# Patient Record
Sex: Female | Born: 1945
Health system: Southern US, Community
[De-identification: ages and names within clinical notes are randomized; demographics above are authoritative.]

## PROBLEM LIST (undated history)

## (undated) DIAGNOSIS — C189 Malignant neoplasm of colon, unspecified: Secondary | ICD-10-CM

## (undated) DIAGNOSIS — K862 Cyst of pancreas: Secondary | ICD-10-CM

## (undated) DIAGNOSIS — K7689 Other specified diseases of liver: Secondary | ICD-10-CM

## (undated) DIAGNOSIS — K222 Esophageal obstruction: Secondary | ICD-10-CM

## (undated) DIAGNOSIS — K635 Polyp of colon: Secondary | ICD-10-CM

## (undated) DIAGNOSIS — F1020 Alcohol dependence, uncomplicated: Secondary | ICD-10-CM

## (undated) DIAGNOSIS — I10 Essential (primary) hypertension: Secondary | ICD-10-CM

## (undated) HISTORY — DX: Other specified diseases of liver: K76.89

## (undated) HISTORY — DX: Essential (primary) hypertension: I10

## (undated) HISTORY — DX: Polyp of colon: K63.5

## (undated) HISTORY — DX: Esophageal obstruction: K22.2

## (undated) HISTORY — DX: Malignant neoplasm of colon, unspecified: C18.9

## (undated) HISTORY — DX: Alcohol dependence, uncomplicated: F10.20

## (undated) HISTORY — DX: Cyst of pancreas: K86.2

---

## 2000-05-04 ENCOUNTER — Encounter: Payer: Self-pay | Admitting: Orthopedic Surgery

## 2000-05-04 ENCOUNTER — Emergency Department (HOSPITAL_COMMUNITY): Admission: EM | Admit: 2000-05-04 | Discharge: 2000-05-04 | Payer: Self-pay | Admitting: Emergency Medicine

## 2000-05-04 ENCOUNTER — Encounter: Payer: Self-pay | Admitting: Emergency Medicine

## 2002-02-18 ENCOUNTER — Ambulatory Visit (HOSPITAL_COMMUNITY): Admission: RE | Admit: 2002-02-18 | Discharge: 2002-02-18 | Payer: Self-pay | Admitting: Family Medicine

## 2002-02-20 ENCOUNTER — Encounter: Admission: RE | Admit: 2002-02-20 | Discharge: 2002-02-20 | Payer: Self-pay | Admitting: Family Medicine

## 2002-02-20 ENCOUNTER — Encounter: Payer: Self-pay | Admitting: Family Medicine

## 2005-07-06 ENCOUNTER — Ambulatory Visit: Payer: Self-pay | Admitting: Internal Medicine

## 2005-08-21 ENCOUNTER — Ambulatory Visit: Payer: Self-pay | Admitting: Internal Medicine

## 2005-08-24 ENCOUNTER — Ambulatory Visit (HOSPITAL_COMMUNITY): Admission: RE | Admit: 2005-08-24 | Discharge: 2005-08-24 | Payer: Self-pay | Admitting: Internal Medicine

## 2006-11-01 ENCOUNTER — Ambulatory Visit: Payer: Self-pay | Admitting: Internal Medicine

## 2006-12-08 ENCOUNTER — Encounter: Payer: Self-pay | Admitting: Internal Medicine

## 2006-12-08 DIAGNOSIS — I10 Essential (primary) hypertension: Secondary | ICD-10-CM | POA: Insufficient documentation

## 2008-01-03 ENCOUNTER — Telehealth: Payer: Self-pay | Admitting: Internal Medicine

## 2008-01-07 ENCOUNTER — Encounter: Payer: Self-pay | Admitting: Internal Medicine

## 2008-02-24 ENCOUNTER — Ambulatory Visit: Payer: Self-pay | Admitting: Internal Medicine

## 2008-02-24 DIAGNOSIS — F17200 Nicotine dependence, unspecified, uncomplicated: Secondary | ICD-10-CM | POA: Insufficient documentation

## 2008-05-01 DIAGNOSIS — C189 Malignant neoplasm of colon, unspecified: Secondary | ICD-10-CM

## 2008-05-01 DIAGNOSIS — K862 Cyst of pancreas: Secondary | ICD-10-CM

## 2008-05-01 HISTORY — DX: Malignant neoplasm of colon, unspecified: C18.9

## 2008-05-01 HISTORY — PX: PORTACATH PLACEMENT: SHX2246

## 2008-05-01 HISTORY — DX: Cyst of pancreas: K86.2

## 2008-05-01 HISTORY — PX: COLECTOMY: SHX59

## 2008-11-23 ENCOUNTER — Encounter: Admission: RE | Admit: 2008-11-23 | Discharge: 2008-11-23 | Payer: Self-pay | Admitting: Emergency Medicine

## 2008-11-26 ENCOUNTER — Encounter: Payer: Self-pay | Admitting: Internal Medicine

## 2008-11-30 ENCOUNTER — Encounter: Admission: RE | Admit: 2008-11-30 | Discharge: 2008-11-30 | Payer: Self-pay | Admitting: General Surgery

## 2008-12-01 ENCOUNTER — Encounter: Admission: RE | Admit: 2008-12-01 | Discharge: 2008-12-01 | Payer: Self-pay | Admitting: General Surgery

## 2008-12-15 ENCOUNTER — Encounter: Payer: Self-pay | Admitting: Internal Medicine

## 2008-12-18 ENCOUNTER — Ambulatory Visit: Payer: Self-pay | Admitting: Internal Medicine

## 2008-12-18 DIAGNOSIS — R109 Unspecified abdominal pain: Secondary | ICD-10-CM | POA: Insufficient documentation

## 2008-12-18 DIAGNOSIS — Z85038 Personal history of other malignant neoplasm of large intestine: Secondary | ICD-10-CM | POA: Insufficient documentation

## 2008-12-18 DIAGNOSIS — F1020 Alcohol dependence, uncomplicated: Secondary | ICD-10-CM | POA: Insufficient documentation

## 2008-12-18 DIAGNOSIS — C189 Malignant neoplasm of colon, unspecified: Secondary | ICD-10-CM | POA: Insufficient documentation

## 2008-12-18 LAB — CONVERTED CEMR LAB: Vit D, 25-Hydroxy: 32 ng/mL (ref 30–89)

## 2008-12-21 ENCOUNTER — Ambulatory Visit: Payer: Self-pay | Admitting: Internal Medicine

## 2009-01-01 ENCOUNTER — Inpatient Hospital Stay (HOSPITAL_COMMUNITY): Admission: EM | Admit: 2009-01-01 | Discharge: 2009-01-08 | Payer: Self-pay | Admitting: Emergency Medicine

## 2009-01-01 ENCOUNTER — Encounter (INDEPENDENT_AMBULATORY_CARE_PROVIDER_SITE_OTHER): Payer: Self-pay | Admitting: Surgery

## 2009-01-12 ENCOUNTER — Encounter: Payer: Self-pay | Admitting: Internal Medicine

## 2009-01-13 ENCOUNTER — Ambulatory Visit: Payer: Self-pay | Admitting: Hematology & Oncology

## 2009-02-03 ENCOUNTER — Encounter: Payer: Self-pay | Admitting: Internal Medicine

## 2009-02-03 LAB — CBC WITH DIFFERENTIAL (CANCER CENTER ONLY)
EOS%: 1.7 % (ref 0.0–7.0)
Eosinophils Absolute: 0.1 10*3/uL (ref 0.0–0.5)
LYMPH#: 2.8 10*3/uL (ref 0.9–3.3)
MCH: 31.1 pg (ref 26.0–34.0)
MONO%: 6.1 % (ref 0.0–13.0)
NEUT#: 1.5 10*3/uL (ref 1.5–6.5)
Platelets: 246 10*3/uL (ref 145–400)
RBC: 4.09 10*6/uL (ref 3.70–5.32)

## 2009-02-04 ENCOUNTER — Encounter: Payer: Self-pay | Admitting: Internal Medicine

## 2009-02-04 LAB — COMPREHENSIVE METABOLIC PANEL
ALT: 24 U/L (ref 0–35)
AST: 25 U/L (ref 0–37)
BUN: 9 mg/dL (ref 6–23)
Calcium: 9 mg/dL (ref 8.4–10.5)
Creatinine, Ser: 0.88 mg/dL (ref 0.40–1.20)
Total Bilirubin: 0.3 mg/dL (ref 0.3–1.2)

## 2009-02-04 LAB — CEA: CEA: 1.1 ng/mL (ref 0.0–5.0)

## 2009-02-09 ENCOUNTER — Ambulatory Visit: Admission: RE | Admit: 2009-02-09 | Discharge: 2009-03-22 | Payer: Self-pay | Admitting: Radiation Oncology

## 2009-02-18 ENCOUNTER — Ambulatory Visit: Payer: Self-pay | Admitting: Hematology & Oncology

## 2009-02-19 ENCOUNTER — Ambulatory Visit (HOSPITAL_COMMUNITY): Admission: RE | Admit: 2009-02-19 | Discharge: 2009-02-19 | Payer: Self-pay | Admitting: Hematology & Oncology

## 2009-02-23 ENCOUNTER — Ambulatory Visit: Payer: Self-pay | Admitting: Internal Medicine

## 2009-02-23 DIAGNOSIS — Z85038 Personal history of other malignant neoplasm of large intestine: Secondary | ICD-10-CM | POA: Insufficient documentation

## 2009-03-08 LAB — CBC WITH DIFFERENTIAL (CANCER CENTER ONLY)
BASO#: 0 10*3/uL (ref 0.0–0.2)
BASO%: 0.8 % (ref 0.0–2.0)
EOS%: 1.7 % (ref 0.0–7.0)
HCT: 39 % (ref 34.8–46.6)
HGB: 13.3 g/dL (ref 11.6–15.9)
LYMPH%: 68.4 % — ABNORMAL HIGH (ref 14.0–48.0)
MCH: 29.9 pg (ref 26.0–34.0)
MCHC: 34 g/dL (ref 32.0–36.0)
MONO%: 6.9 % (ref 0.0–13.0)
NEUT%: 22.2 % — ABNORMAL LOW (ref 39.6–80.0)
RDW: 12.3 % (ref 10.5–14.6)

## 2009-03-08 LAB — COMPREHENSIVE METABOLIC PANEL
AST: 36 U/L (ref 0–37)
Albumin: 4.5 g/dL (ref 3.5–5.2)
BUN: 11 mg/dL (ref 6–23)
Calcium: 9.4 mg/dL (ref 8.4–10.5)
Chloride: 100 mEq/L (ref 96–112)
Creatinine, Ser: 0.71 mg/dL (ref 0.40–1.20)
Glucose, Bld: 115 mg/dL — ABNORMAL HIGH (ref 70–99)

## 2009-03-22 ENCOUNTER — Ambulatory Visit: Payer: Self-pay | Admitting: Hematology & Oncology

## 2009-03-22 LAB — COMPREHENSIVE METABOLIC PANEL
ALT: 34 U/L (ref 0–35)
AST: 38 U/L — ABNORMAL HIGH (ref 0–37)
Albumin: 4.1 g/dL (ref 3.5–5.2)
CO2: 28 mEq/L (ref 19–32)
Calcium: 9.7 mg/dL (ref 8.4–10.5)
Chloride: 105 mEq/L (ref 96–112)
Creatinine, Ser: 0.64 mg/dL (ref 0.40–1.20)
Potassium: 4 mEq/L (ref 3.5–5.3)
Sodium: 143 mEq/L (ref 135–145)
Total Protein: 7 g/dL (ref 6.0–8.3)

## 2009-03-22 LAB — CBC WITH DIFFERENTIAL (CANCER CENTER ONLY)
Eosinophils Absolute: 0 10*3/uL (ref 0.0–0.5)
MCV: 87 fL (ref 81–101)
MONO#: 0.3 10*3/uL (ref 0.1–0.9)
NEUT#: 0.1 10*3/uL — CL (ref 1.5–6.5)
Platelets: 116 10*3/uL — ABNORMAL LOW (ref 145–400)
RBC: 4.31 10*6/uL (ref 3.70–5.32)
WBC: 2.2 10*3/uL — ABNORMAL LOW (ref 3.9–10.0)

## 2009-03-30 ENCOUNTER — Encounter: Payer: Self-pay | Admitting: Internal Medicine

## 2009-03-30 LAB — CBC WITH DIFFERENTIAL (CANCER CENTER ONLY)
BASO#: 0 10*3/uL (ref 0.0–0.2)
BASO%: 0.9 % (ref 0.0–2.0)
EOS%: 1 % (ref 0.0–7.0)
MCH: 30.2 pg (ref 26.0–34.0)
MCHC: 34.3 g/dL (ref 32.0–36.0)
MONO%: 12.6 % (ref 0.0–13.0)
NEUT#: 1 10*3/uL — ABNORMAL LOW (ref 1.5–6.5)
Platelets: 212 10*3/uL (ref 145–400)
RDW: 13.5 % (ref 10.5–14.6)

## 2009-04-05 LAB — CBC WITH DIFFERENTIAL (CANCER CENTER ONLY)
BASO#: 0 10*3/uL (ref 0.0–0.2)
Eosinophils Absolute: 0.1 10*3/uL (ref 0.0–0.5)
HGB: 13.1 g/dL (ref 11.6–15.9)
LYMPH%: 43.9 % (ref 14.0–48.0)
MCH: 29.8 pg (ref 26.0–34.0)
MCV: 89 fL (ref 81–101)
MONO#: 0.3 10*3/uL (ref 0.1–0.9)
MONO%: 6.9 % (ref 0.0–13.0)
NEUT#: 2.2 10*3/uL (ref 1.5–6.5)
Platelets: 250 10*3/uL (ref 145–400)
RBC: 4.39 10*6/uL (ref 3.70–5.32)
WBC: 4.7 10*3/uL (ref 3.9–10.0)

## 2009-04-22 ENCOUNTER — Ambulatory Visit: Payer: Self-pay | Admitting: Hematology & Oncology

## 2009-04-26 ENCOUNTER — Telehealth: Payer: Self-pay | Admitting: Internal Medicine

## 2009-04-26 LAB — MANUAL DIFFERENTIAL (CHCC SATELLITE)
ALC: 1.7 10*3/uL (ref 0.6–2.2)
ANC (CHCC HP manual diff): 0.4 10*3/uL — CL (ref 1.5–6.7)
BASO: 1 % (ref 0–2)
PLT EST ~~LOC~~: ADEQUATE
RBC Comments: NORMAL

## 2009-04-26 LAB — COMPREHENSIVE METABOLIC PANEL
ALT: 10 U/L (ref 0–35)
AST: 8 U/L (ref 0–37)
Alkaline Phosphatase: 96 U/L (ref 39–117)
Calcium: 9.3 mg/dL (ref 8.4–10.5)
Chloride: 99 mEq/L (ref 96–112)
Creatinine, Ser: 0.95 mg/dL (ref 0.40–1.20)
Potassium: 3.3 mEq/L — ABNORMAL LOW (ref 3.5–5.3)

## 2009-04-26 LAB — CBC WITH DIFFERENTIAL (CANCER CENTER ONLY)
HCT: 38.8 % (ref 34.8–46.6)
MCH: 30.8 pg (ref 26.0–34.0)
MCV: 90 fL (ref 81–101)
RBC: 4.32 10*6/uL (ref 3.70–5.32)
WBC: 3 10*3/uL — ABNORMAL LOW (ref 3.9–10.0)

## 2009-04-27 ENCOUNTER — Ambulatory Visit: Payer: Self-pay | Admitting: Internal Medicine

## 2009-05-01 HISTORY — PX: OTHER SURGICAL HISTORY: SHX169

## 2009-05-03 ENCOUNTER — Encounter: Payer: Self-pay | Admitting: Internal Medicine

## 2009-05-03 LAB — COMPREHENSIVE METABOLIC PANEL
AST: 36 U/L (ref 0–37)
Alkaline Phosphatase: 83 U/L (ref 39–117)
BUN: 13 mg/dL (ref 6–23)
Calcium: 9.7 mg/dL (ref 8.4–10.5)
Chloride: 102 mEq/L (ref 96–112)
Glucose, Bld: 113 mg/dL — ABNORMAL HIGH (ref 70–99)
Total Protein: 7.6 g/dL (ref 6.0–8.3)

## 2009-05-03 LAB — CBC WITH DIFFERENTIAL (CANCER CENTER ONLY)
BASO#: 0 10*3/uL (ref 0.0–0.2)
LYMPH#: 1.9 10*3/uL (ref 0.9–3.3)
MCH: 30.6 pg (ref 26.0–34.0)
MCHC: 33 g/dL (ref 32.0–36.0)
MONO#: 0.5 10*3/uL (ref 0.1–0.9)
MONO%: 11.2 % (ref 0.0–13.0)
RDW: 17.2 % — ABNORMAL HIGH (ref 10.5–14.6)

## 2009-05-17 ENCOUNTER — Encounter: Payer: Self-pay | Admitting: Internal Medicine

## 2009-05-17 ENCOUNTER — Ambulatory Visit: Payer: Self-pay | Admitting: Hematology & Oncology

## 2009-05-17 LAB — CBC WITH DIFFERENTIAL (CANCER CENTER ONLY)
BASO%: 0.4 % (ref 0.0–2.0)
Eosinophils Absolute: 0 10*3/uL (ref 0.0–0.5)
HCT: 40.3 % (ref 34.8–46.6)
LYMPH%: 57.1 % — ABNORMAL HIGH (ref 14.0–48.0)
MCHC: 33.6 g/dL (ref 32.0–36.0)
NEUT#: 1.1 10*3/uL — ABNORMAL LOW (ref 1.5–6.5)
NEUT%: 31.7 % — ABNORMAL LOW (ref 39.6–80.0)
RBC: 4.38 10*6/uL (ref 3.70–5.32)
RDW: 17.2 % — ABNORMAL HIGH (ref 10.5–14.6)
WBC: 3.5 10*3/uL — ABNORMAL LOW (ref 3.9–10.0)

## 2009-05-17 LAB — COMPREHENSIVE METABOLIC PANEL
ALT: 23 U/L (ref 0–35)
Alkaline Phosphatase: 83 U/L (ref 39–117)
BUN: 11 mg/dL (ref 6–23)
Calcium: 9.4 mg/dL (ref 8.4–10.5)
Creatinine, Ser: 0.89 mg/dL (ref 0.40–1.20)
Glucose, Bld: 81 mg/dL (ref 70–99)
Potassium: 3.5 mEq/L (ref 3.5–5.3)
Total Bilirubin: 0.3 mg/dL (ref 0.3–1.2)
Total Protein: 8.1 g/dL (ref 6.0–8.3)

## 2009-05-17 LAB — CEA: CEA: 1.2 ng/mL (ref 0.0–5.0)

## 2009-05-31 ENCOUNTER — Encounter: Payer: Self-pay | Admitting: Internal Medicine

## 2009-05-31 LAB — TECHNOLOGIST REVIEW CHCC SATELLITE

## 2009-05-31 LAB — CBC WITH DIFFERENTIAL (CANCER CENTER ONLY)
BASO#: 0 10*3/uL (ref 0.0–0.2)
BASO%: 0.7 % (ref 0.0–2.0)
Eosinophils Absolute: 0 10*3/uL (ref 0.0–0.5)
LYMPH#: 1.7 10*3/uL (ref 0.9–3.3)
MCHC: 33 g/dL (ref 32.0–36.0)
MONO%: 12.9 % (ref 0.0–13.0)

## 2009-05-31 LAB — COMPREHENSIVE METABOLIC PANEL
ALT: 29 U/L (ref 0–35)
Creatinine, Ser: 0.62 mg/dL (ref 0.40–1.20)
Sodium: 140 mEq/L (ref 135–145)
Total Bilirubin: 0.3 mg/dL (ref 0.3–1.2)

## 2009-06-25 ENCOUNTER — Ambulatory Visit: Payer: Self-pay | Admitting: Hematology & Oncology

## 2009-06-28 ENCOUNTER — Encounter: Payer: Self-pay | Admitting: Internal Medicine

## 2009-06-28 LAB — CBC WITH DIFFERENTIAL (CANCER CENTER ONLY)
BASO%: 0.6 % (ref 0.0–2.0)
EOS%: 0.9 % (ref 0.0–7.0)
LYMPH%: 46.5 % (ref 14.0–48.0)
MCH: 32.2 pg (ref 26.0–34.0)
MCV: 97 fL (ref 81–101)
MONO#: 0.4 10*3/uL (ref 0.1–0.9)
MONO%: 11.1 % (ref 0.0–13.0)
Platelets: 214 10*3/uL (ref 145–400)
RDW: 13.3 % (ref 10.5–14.6)
WBC: 3.7 10*3/uL — ABNORMAL LOW (ref 3.9–10.0)

## 2009-06-28 LAB — COMPREHENSIVE METABOLIC PANEL
ALT: 35 U/L (ref 0–35)
AST: 48 U/L — ABNORMAL HIGH (ref 0–37)
Alkaline Phosphatase: 87 U/L (ref 39–117)
Sodium: 143 mEq/L (ref 135–145)
Total Bilirubin: 0.4 mg/dL (ref 0.3–1.2)
Total Protein: 7.9 g/dL (ref 6.0–8.3)

## 2009-07-12 ENCOUNTER — Encounter: Payer: Self-pay | Admitting: Internal Medicine

## 2009-07-12 LAB — CBC WITH DIFFERENTIAL (CANCER CENTER ONLY)
Eosinophils Absolute: 0 10*3/uL (ref 0.0–0.5)
MONO#: 0.3 10*3/uL (ref 0.1–0.9)
MONO%: 8.3 % (ref 0.0–13.0)
NEUT#: 1.8 10*3/uL (ref 1.5–6.5)
Platelets: 210 10*3/uL (ref 145–400)
RBC: 4.08 10*6/uL (ref 3.70–5.32)
WBC: 3.7 10*3/uL — ABNORMAL LOW (ref 3.9–10.0)

## 2009-07-12 LAB — COMPREHENSIVE METABOLIC PANEL
ALT: 29 U/L (ref 0–35)
CO2: 25 mEq/L (ref 19–32)
Chloride: 103 mEq/L (ref 96–112)
Sodium: 140 mEq/L (ref 135–145)
Total Bilirubin: 0.4 mg/dL (ref 0.3–1.2)
Total Protein: 7.7 g/dL (ref 6.0–8.3)

## 2009-07-12 LAB — TECHNOLOGIST REVIEW CHCC SATELLITE

## 2009-08-02 ENCOUNTER — Ambulatory Visit: Payer: Self-pay | Admitting: Hematology & Oncology

## 2009-08-03 ENCOUNTER — Encounter: Payer: Self-pay | Admitting: Internal Medicine

## 2009-08-03 LAB — CBC WITH DIFFERENTIAL (CANCER CENTER ONLY)
EOS%: 1.4 % (ref 0.0–7.0)
Eosinophils Absolute: 0.1 10*3/uL (ref 0.0–0.5)
LYMPH#: 2.6 10*3/uL (ref 0.9–3.3)
MCH: 32.7 pg (ref 26.0–34.0)
MONO%: 11.4 % (ref 0.0–13.0)
NEUT#: 1.2 10*3/uL — ABNORMAL LOW (ref 1.5–6.5)
Platelets: 321 10*3/uL (ref 145–400)
RBC: 4.1 10*6/uL (ref 3.70–5.32)

## 2009-08-03 LAB — COMPREHENSIVE METABOLIC PANEL
AST: 43 U/L — ABNORMAL HIGH (ref 0–37)
Albumin: 4.2 g/dL (ref 3.5–5.2)
Alkaline Phosphatase: 106 U/L (ref 39–117)
Chloride: 103 mEq/L (ref 96–112)
Glucose, Bld: 95 mg/dL (ref 70–99)
Potassium: 4.4 mEq/L (ref 3.5–5.3)
Sodium: 140 mEq/L (ref 135–145)
Total Protein: 7.7 g/dL (ref 6.0–8.3)

## 2009-08-24 ENCOUNTER — Encounter: Payer: Self-pay | Admitting: Internal Medicine

## 2009-08-24 LAB — CBC WITH DIFFERENTIAL (CANCER CENTER ONLY)
BASO%: 1.2 % (ref 0.0–2.0)
EOS%: 1.6 % (ref 0.0–7.0)
MCH: 32.9 pg (ref 26.0–34.0)
MCHC: 33.4 g/dL (ref 32.0–36.0)
MONO%: 14.5 % — ABNORMAL HIGH (ref 0.0–13.0)
NEUT#: 0.9 10*3/uL — ABNORMAL LOW (ref 1.5–6.5)
Platelets: 247 10*3/uL (ref 145–400)

## 2009-09-06 ENCOUNTER — Ambulatory Visit: Payer: Self-pay | Admitting: Hematology & Oncology

## 2009-09-07 ENCOUNTER — Encounter: Payer: Self-pay | Admitting: Internal Medicine

## 2009-09-07 LAB — CBC WITH DIFFERENTIAL (CANCER CENTER ONLY)
EOS%: 1.7 % (ref 0.0–7.0)
HCT: 41.6 % (ref 34.8–46.6)
HGB: 13.9 g/dL (ref 11.6–15.9)
MCH: 33.3 pg (ref 26.0–34.0)
MCV: 100 fL (ref 81–101)
MONO#: 0.4 10*3/uL (ref 0.1–0.9)
NEUT%: 25.8 % — ABNORMAL LOW (ref 39.6–80.0)
Platelets: 184 10*3/uL (ref 145–400)

## 2009-09-07 LAB — TECHNOLOGIST REVIEW CHCC SATELLITE

## 2009-09-21 ENCOUNTER — Ambulatory Visit (HOSPITAL_BASED_OUTPATIENT_CLINIC_OR_DEPARTMENT_OTHER): Admission: RE | Admit: 2009-09-21 | Discharge: 2009-09-21 | Payer: Self-pay | Admitting: Hematology & Oncology

## 2009-09-21 ENCOUNTER — Encounter: Payer: Self-pay | Admitting: Internal Medicine

## 2009-09-21 ENCOUNTER — Ambulatory Visit: Payer: Self-pay | Admitting: Radiology

## 2009-09-21 LAB — CBC WITH DIFFERENTIAL (CANCER CENTER ONLY)
BASO%: 0.9 % (ref 0.0–2.0)
Eosinophils Absolute: 0 10*3/uL (ref 0.0–0.5)
HGB: 13.3 g/dL (ref 11.6–15.9)
MCV: 99 fL (ref 81–101)
MONO%: 11.9 % (ref 0.0–13.0)
NEUT%: 44.8 % (ref 39.6–80.0)
RBC: 4.04 10*6/uL (ref 3.70–5.32)

## 2009-09-21 LAB — CEA: CEA: 1.4 ng/mL (ref 0.0–5.0)

## 2009-10-13 ENCOUNTER — Ambulatory Visit: Payer: Self-pay | Admitting: Hematology & Oncology

## 2009-10-14 ENCOUNTER — Encounter: Payer: Self-pay | Admitting: Internal Medicine

## 2009-10-14 LAB — COMPREHENSIVE METABOLIC PANEL
AST: 54 U/L — ABNORMAL HIGH (ref 0–37)
Alkaline Phosphatase: 130 U/L — ABNORMAL HIGH (ref 39–117)
BUN: 8 mg/dL (ref 6–23)
Creatinine, Ser: 0.8 mg/dL (ref 0.40–1.20)
Glucose, Bld: 93 mg/dL (ref 70–99)
Total Protein: 8.3 g/dL (ref 6.0–8.3)

## 2009-10-14 LAB — CBC WITH DIFFERENTIAL (CANCER CENTER ONLY)
BASO#: 0 10*3/uL (ref 0.0–0.2)
Eosinophils Absolute: 0.1 10*3/uL (ref 0.0–0.5)
LYMPH#: 2.4 10*3/uL (ref 0.9–3.3)
LYMPH%: 59.7 % — ABNORMAL HIGH (ref 14.0–48.0)
MCHC: 33.7 g/dL (ref 32.0–36.0)
MONO#: 0.5 10*3/uL (ref 0.1–0.9)
RDW: 13.8 % (ref 10.5–14.6)

## 2009-11-11 ENCOUNTER — Ambulatory Visit (HOSPITAL_BASED_OUTPATIENT_CLINIC_OR_DEPARTMENT_OTHER): Admission: RE | Admit: 2009-11-11 | Discharge: 2009-11-11 | Payer: Self-pay | Admitting: Hematology & Oncology

## 2009-11-11 ENCOUNTER — Ambulatory Visit: Payer: Self-pay | Admitting: Diagnostic Radiology

## 2009-11-30 ENCOUNTER — Ambulatory Visit: Payer: Self-pay | Admitting: Hematology & Oncology

## 2009-12-01 ENCOUNTER — Encounter: Payer: Self-pay | Admitting: Internal Medicine

## 2009-12-01 ENCOUNTER — Encounter (INDEPENDENT_AMBULATORY_CARE_PROVIDER_SITE_OTHER): Payer: Self-pay | Admitting: *Deleted

## 2009-12-01 LAB — CBC WITH DIFFERENTIAL (CANCER CENTER ONLY)
BASO#: 0.1 10*3/uL (ref 0.0–0.2)
BASO%: 1.8 % (ref 0.0–2.0)
Eosinophils Absolute: 0.1 10*3/uL (ref 0.0–0.5)
HGB: 15.4 g/dL (ref 11.6–15.9)
MCH: 31.9 pg (ref 26.0–34.0)
MCHC: 32.9 g/dL (ref 32.0–36.0)
MONO#: 0.7 10*3/uL (ref 0.1–0.9)
NEUT%: 34 % — ABNORMAL LOW (ref 39.6–80.0)
RBC: 4.84 10*6/uL (ref 3.70–5.32)

## 2009-12-01 LAB — COMPREHENSIVE METABOLIC PANEL
Albumin: 4.6 g/dL (ref 3.5–5.2)
BUN: 10 mg/dL (ref 6–23)
CO2: 25 mEq/L (ref 19–32)
Creatinine, Ser: 0.72 mg/dL (ref 0.40–1.20)
Glucose, Bld: 93 mg/dL (ref 70–99)
Potassium: 4.5 mEq/L (ref 3.5–5.3)
Sodium: 140 mEq/L (ref 135–145)
Total Bilirubin: 0.5 mg/dL (ref 0.3–1.2)

## 2009-12-08 ENCOUNTER — Encounter (INDEPENDENT_AMBULATORY_CARE_PROVIDER_SITE_OTHER): Payer: Self-pay | Admitting: *Deleted

## 2009-12-13 ENCOUNTER — Ambulatory Visit: Payer: Self-pay | Admitting: Gastroenterology

## 2009-12-27 ENCOUNTER — Ambulatory Visit: Payer: Self-pay | Admitting: Gastroenterology

## 2009-12-27 LAB — HM COLONOSCOPY

## 2009-12-29 ENCOUNTER — Encounter: Payer: Self-pay | Admitting: Gastroenterology

## 2010-01-11 ENCOUNTER — Ambulatory Visit: Payer: Self-pay | Admitting: Hematology & Oncology

## 2010-01-24 ENCOUNTER — Ambulatory Visit: Payer: Self-pay | Admitting: Internal Medicine

## 2010-02-07 ENCOUNTER — Ambulatory Visit (HOSPITAL_COMMUNITY): Admission: RE | Admit: 2010-02-07 | Discharge: 2010-02-07 | Payer: Self-pay | Admitting: Hematology & Oncology

## 2010-02-21 ENCOUNTER — Ambulatory Visit: Payer: Self-pay | Admitting: Hematology & Oncology

## 2010-03-31 ENCOUNTER — Ambulatory Visit: Payer: Self-pay | Admitting: Hematology & Oncology

## 2010-04-14 ENCOUNTER — Encounter: Payer: Self-pay | Admitting: Internal Medicine

## 2010-05-08 ENCOUNTER — Emergency Department (HOSPITAL_COMMUNITY)
Admission: EM | Admit: 2010-05-08 | Discharge: 2010-05-08 | Payer: Self-pay | Source: Home / Self Care | Admitting: Emergency Medicine

## 2010-05-11 ENCOUNTER — Ambulatory Visit: Payer: Self-pay | Admitting: Hematology & Oncology

## 2010-05-12 ENCOUNTER — Encounter: Payer: Self-pay | Admitting: Internal Medicine

## 2010-05-29 LAB — CONVERTED CEMR LAB
ALT: 28 units/L (ref 0–35)
AST: 36 units/L (ref 0–37)
Alkaline Phosphatase: 89 units/L (ref 39–117)
Basophils Relative: 1.1 % (ref 0.0–3.0)
Bilirubin, Direct: 0.1 mg/dL (ref 0.0–0.3)
Calcium: 9.6 mg/dL (ref 8.4–10.5)
Cholesterol: 218 mg/dL — ABNORMAL HIGH (ref 0–200)
Creatinine, Ser: 0.7 mg/dL (ref 0.4–1.2)
GFR calc non Af Amer: 108.12 mL/min (ref >60)
HCT: 47.7 % — ABNORMAL HIGH (ref 36.0–46.0)
HDL: 97.2 mg/dL (ref >39.00)
Hemoglobin: 15.5 g/dL — ABNORMAL HIGH (ref 12.0–15.0)
MCHC: 32.5 g/dL (ref 30.0–36.0)
Monocytes Absolute: 0.5 10*3/uL (ref 0.1–1.0)
Monocytes Relative: 9.9 % (ref 3.0–12.0)
Neutro Abs: 2.2 10*3/uL (ref 1.4–7.7)
RDW: 12.8 % (ref 11.5–14.6)
Sodium: 143 meq/L (ref 135–145)
VLDL: 16.4 mg/dL (ref 0.0–40.0)
Vitamin B-12: 538 pg/mL (ref 211–911)

## 2010-05-31 NOTE — Letter (Signed)
[facility]   Imported By: Lester [place] 09/16/2009 12:46:15  _____________________________________________________________________  External Attachment:    Type:   Image     Comment:   External Document

## 2010-05-31 NOTE — Letter (Signed)
[facility]   Imported By: Sherian Rein 10/05/2009 14:34:20  _____________________________________________________________________  External Attachment:    Type:   Image     Comment:   External Document

## 2010-05-31 NOTE — Letter (Signed)
[facility]   Imported By: Sherian Rein 05/27/2009 13:41:34  _____________________________________________________________________  External Attachment:    Type:   Image     Comment:   External Document

## 2010-05-31 NOTE — Letter (Signed)
[facility]   Imported By: Lester [place] 07/28/2009 08:00:32  _____________________________________________________________________  External Attachment:    Type:   Image     Comment:   External Document

## 2010-05-31 NOTE — Letter (Signed)
[facility]   Imported By: Sherian Rein 11/03/2009 09:53:16  _____________________________________________________________________  External Attachment:    Type:   Image     Comment:   External Document

## 2010-05-31 NOTE — Procedures (Signed)
Summary: Colonoscopy  Patient: Diana Barker Note: All result statuses are Final unless otherwise noted.  Tests: (1) Colonoscopy (COL)   COL Colonoscopy           DONE     [facility]     [address]     [place], [place]  [address]           COLONOSCOPY PROCEDURE REPORT           PATIENT:  [name], [name]  MR#:  [id number]     BIRTHDATE:  1945-06-13, 63 yrs. old  GENDER:  female     ENDOSCOPIST:  Vania Rea. Jarold Motto, MD, [facility]     REF. BY:  Arlan Organ, M.D.     PROCEDURE DATE:  12/27/2009     PROCEDURE:  Colonoscopy with biopsy, Colonoscopy with biopsy and     snare polypectomy     ASA CLASS:  Class II     INDICATIONS:  RIGHT HEMICOLECTOMY IN SEPTEMBER 2010 FOR CARCINOMA.           MEDICATIONS:   Fentanyl 50 mcg IV, Versed 7 mg IV           DESCRIPTION OF PROCEDURE:   After the risks benefits and     alternatives of the procedure were thoroughly explained, informed     consent was obtained.  Digital rectal exam was performed and     revealed no abnormalities.   The LB PCF-H180AL [id number] endoscope     was introduced through the anus and advanced to the anastomosis,     without limitations.  The quality of the prep was excellent, using     MoviPrep.  The instrument was then slowly withdrawn as the colon     was fully examined.     <<PROCEDUREIMAGES>>           FINDINGS:  There was a surgical anastomosis in the mid transverse     colon. ILEO-COLOSTOMY APPEARS NORMAL.SCATTERED POLYPS SNARE     AND COLD BIOPSY REMOVED IN TV COLON AND RECTUM.   Retroflexed     views in the rectum revealed no abnormalities.    The scope was     then withdrawn from the patient and the procedure completed.           COMPLICATIONS:  None     ENDOSCOPIC IMPRESSION:     1) Anastomosis in the mid transverse colon     R/O ADENOMAS.HX OF RIGHT HEMICOLECTOMY FOR CARCINOMA.     RECOMMENDATIONS:     3YEAR F/U     REPEAT EXAM:  No           ______________________________     Vania Rea.  Jarold Motto, MD, Clementeen Graham           CC:  Linda Hedges. Plotnikov, MDMatthew Corliss Skains, MD           n.     Rosalie DoctorVania Rea. Patterson at 12/27/2009 11:27 AM           Theresia Lo, [id number]  Note: An exclamation mark (!) indicates a result that was not dispersed into the flowsheet. Document Creation Date: 12/27/2009 11:28 AM _______________________________________________________________________  (1) Order result status: Final Collection or observation date-time: 12/27/2009 11:17 Requested date-time:  Receipt date-time:  Reported date-time:  Referring Physician:   Ordering Physician: Sheryn Bison [phone]) Specimen Source:  Source: Launa Grill Order Number: [id number] Lab site:   Appended Document: Colonoscopy     Procedures Next  Due Date:    Colonoscopy: 11/2012

## 2010-05-31 NOTE — Letter (Signed)
Summary: Previsit letter  [facility]  [address] [place], [place] [address]   Phone: [phone]  Fax: [phone]       12/01/2009 MRN: [id number]  [facility] [address] [place], [place]  [address]  Dear Diana Barker,  Welcome to the Gastroenterology Division at [facility].    You are scheduled to see a nurse for your pre-procedure visit on December 13, 2009 at 11:00am on the 3rd floor at [facility], [address]  We ask that you try to arrive at our office 15 minutes prior to your appointment time to allow for check-in.  Your nurse visit will consist of discussing your medical and surgical history, your immediate family medical history, and your medications.    Please bring a complete list of all your medications or, if you prefer, bring the medication bottles and we will list them.  We will need to be aware of both prescribed and over the counter drugs.  We will need to know exact dosage information as well.  If you are on blood thinners (Coumadin, Plavix, Aggrenox, Ticlid, etc.) please call our office today/prior to your appointment, as we need to consult with your physician about holding your medication.   Please be prepared to read and sign documents such as consent forms, a financial agreement, and acknowledgement forms.  If necessary, and with your consent, a friend or relative is welcome to sit-in on the nurse visit with you.  Please bring your insurance card so that we may make a copy of it.  If your insurance requires a referral to see a specialist, please bring your referral form from your primary care physician.  No co-pay is required for this nurse visit.     If you cannot keep your appointment, please call [phone] to cancel or reschedule prior to your appointment date.  This allows [place] the opportunity to schedule an appointment for another patient in need of care.    Thank you for choosing [facility] for your  medical needs.  We appreciate the opportunity to care for you.  Please visit [place] at our website  to learn more about our practice.                     Sincerely.                                                                                                                   The Gastroenterology Division

## 2010-05-31 NOTE — Letter (Signed)
[facility]   Imported By: Sherian Rein 12/28/2009 09:41:37  _____________________________________________________________________  External Attachment:    Type:   Image     Comment:   External Document

## 2010-05-31 NOTE — Letter (Signed)
[facility]   Imported By: Sherian Rein 06/24/2009 12:21:51  _____________________________________________________________________  External Attachment:    Type:   Image     Comment:   External Document

## 2010-05-31 NOTE — Miscellaneous (Signed)
Summary: [facility]  Clinical Lists Changes  Medications: Added new medication of MOVIPREP 100 GM  SOLR (PEG-KCL-NACL-NASULF-NA ASC-C) As per prep instructions. - Signed Rx of MOVIPREP 100 GM  SOLR (PEG-KCL-NACL-NASULF-NA ASC-C) As per prep instructions.;  #1 x 0;  Signed;  Entered by: Durwin Glaze RN;  Authorized by: Mardella Layman MD [facility];  Method used: Electronically to [facility]  [address] [phone]*, [address] [place], [place]  [address], Ph: [phone] or [phone], Fax: [phone] Observations: Added new observation of NKA: T (12/13/2009 10:50)    Prescriptions: MOVIPREP 100 GM  SOLR (PEG-KCL-NACL-NASULF-NA ASC-C) As per prep instructions.  #1 x 0   Entered by:   Durwin Glaze RN   Authorized by:   Mardella Layman MD [facility]   Signed by:   Durwin Glaze RN on 12/13/2009   Method used:   Electronically to        [facility]  [address] [phone]* (retail)       [address]       [place], [place]  [address]       Ph: [phone] or [phone]       Fax: [phone]   RxID:   [id number]

## 2010-05-31 NOTE — Letter (Signed)
[facility]   Imported By: Lester [place] 08/06/2009 09:07:34  _____________________________________________________________________  External Attachment:    Type:   Image     Comment:   External Document

## 2010-05-31 NOTE — Letter (Signed)
[facility]   Imported By: Sherian Rein 11/08/2009 12:08:33  _____________________________________________________________________  External Attachment:    Type:   Image     Comment:   External Document

## 2010-05-31 NOTE — Letter (Signed)
[facility]   Imported By: Sherian Rein 10/05/2009 14:33:40  _____________________________________________________________________  External Attachment:    Type:   Image     Comment:   External Document

## 2010-05-31 NOTE — Letter (Signed)
Summary: [facility] Instructions  [facility]  [address] [place], [place] [address]   Phone: [phone]  Fax: [phone]       Diana Barker    1946/02/24    MRN: [id number]        Procedure Day /Date:  Monday 12/27/2009     Arrival Time:  9:30 am      Procedure Time: 10:30 am     Location of Procedure:                    _x _  [facility] (4th Floor)                        PREPARATION FOR COLONOSCOPY WITH MOVIPREP   Starting 5 days prior to your procedure Wednesday 8/24 do not eat nuts, seeds, popcorn, corn, beans, peas,  salads, or any raw vegetables.  Do not take any fiber supplements (e.g. Metamucil, Citrucel, and Benefiber).  THE DAY BEFORE YOUR PROCEDURE         DATE: Sunday 8/28  1.  Drink clear liquids the entire day-NO SOLID FOOD  2.  Do not drink anything colored red or purple.  Avoid juices with pulp.  No orange juice.  3.  Drink at least 64 oz. (8 glasses) of fluid/clear liquids during the day to prevent dehydration and help the prep work efficiently.  CLEAR LIQUIDS INCLUDE: Water Jello Ice Popsicles Tea (sugar ok, no milk/cream) Powdered fruit flavored drinks Coffee (sugar ok, no milk/cream) Gatorade Juice: apple, white grape, white cranberry  Lemonade Clear bullion, consomm, broth Carbonated beverages (any kind) Strained chicken noodle soup Hard Candy                             4.  In the morning, mix first dose of MoviPrep solution:    Empty 1 Pouch A and 1 Pouch B into the disposable container    Add lukewarm drinking water to the top line of the container. Mix to dissolve    Refrigerate (mixed solution should be used within 24 hrs)  5.  Begin drinking the prep at 5:00 p.m. The MoviPrep container is divided by 4 marks.   Every 15 minutes drink the solution down to the next mark (approximately 8 oz) until the full liter is complete.   6.  Follow completed prep with 16 oz of clear liquid of your choice  (Nothing red or purple).  Continue to drink clear liquids until bedtime.  7.  Before going to bed, mix second dose of MoviPrep solution:    Empty 1 Pouch A and 1 Pouch B into the disposable container    Add lukewarm drinking water to the top line of the container. Mix to dissolve    Refrigerate  THE DAY OF YOUR PROCEDURE      DATE: Monday 8/29  Beginning at 5:30 a.m. (5 hours before procedure):         1. Every 15 minutes, drink the solution down to the next mark (approx 8 oz) until the full liter is complete.  2. Follow completed prep with 16 oz. of clear liquid of your choice.    3. You may drink clear liquids until 8:30 am (2 HOURS BEFORE PROCEDURE).   MEDICATION INSTRUCTIONS  Unless otherwise instructed, you should take regular prescription medications with a small sip of water   as early as possible the morning  of your procedure.          OTHER INSTRUCTIONS  You will need a responsible adult at least 67 years of age to accompany you and drive you home.   This person must remain in the waiting room during your procedure.  Wear loose fitting clothing that is easily removed.  Leave jewelry and other valuables at home.  However, you may wish to bring a book to read or  an iPod/MP3 player to listen to music as you wait for your procedure to start.  Remove all body piercing jewelry and leave at home.  Total time from sign-in until discharge is approximately 2-3 hours.  You should go home directly after your procedure and rest.  You can resume normal activities the  day after your procedure.  The day of your procedure you should not:   Drive   Make legal decisions   Operate machinery   Drink alcohol   Return to work  You will receive specific instructions about eating, activities and medications before you leave.    The above instructions have been reviewed and explained to me by   Durwin Glaze RN  December 13, 2009 11:06 AM    I fully understand and can  verbalize these instructions _____________________________ Date _________

## 2010-05-31 NOTE — Letter (Signed)
[facility]   Imported By: Lester [place] 06/08/2009 09:55:14  _____________________________________________________________________  External Attachment:    Type:   Image     Comment:   External Document

## 2010-05-31 NOTE — Assessment & Plan Note (Signed)
Summary: OV--PER PT---STC   Vital Signs:  Patient profile:   65 year old female Height:      64 inches Weight:      106 pounds BMI:     18.26 Temp:     98.4 degrees F oral Pulse rate:   68 / minute Pulse rhythm:   regular Resp:     16 per minute BP sitting:   118 / 70  (left arm) Cuff size:   regular  Vitals Entered By: Lanier Prude, Beverly Gust) (January 24, 2010 9:27 AM) CC: f/u Is Patient Diabetic? No   CC:  f/u.  History of Present Illness: The patient presents for a follow up of hypertension, neuropathy, hyperlipidemia, colon ca   Preventive Screening-Counseling & Management  Alcohol-Tobacco     Smoking Status: current     Packs/Day: 0.5  Current Medications (verified): 1)  Atenolol-Chlorthalidone 50-25 Mg  Tabs (Atenolol-Chlorthalidone) .Marland Kitchen.. 1 Once Daily 2)  Vitamin D3 1000 Unit  Tabs (Cholecalciferol) .... 2 By Mouth Daily 3)  Aspirin 81 Mg  Tbec (Aspirin) .... One By Mouth Every Day 4)  Vitamin B-1 100 Mg Tabs (Thiamine Hcl) .Marland Kitchen.. 1 By Mouth Qd 5)  Amlodipine Besylate 5 Mg Tabs (Amlodipine Besylate) .Marland Kitchen.. 1 By Mouth Qd 6)  Gabapentin 300 Mg Caps (Gabapentin) .Marland Kitchen.. 1 Four Times A Day  Allergies (verified): No Known Drug Allergies  Past History:  Social History: Last updated: 04/27/2009 Occupation: diatary aid Current Smoker 1 ppd Alcohol use-yes: 3 24 oz beers qod    Past Medical History: Hypertension Liver and pancr cysts 2010 Alcoholism Colon cancer, hx of 2010 Dr Myna Hidalgo  Past Surgical History: Dental extractions Colectomy hemi 2010 Dr Hilbert Corrigan 2010  Review of Systems  The patient denies fever, dyspnea on exertion, abdominal pain, and melena.    Physical Exam  General:  Thin, NAD Head:  Normocephalic and atraumatic without obvious abnormalities. No apparent alopecia or balding. Nose:  External nasal examination shows no deformity or inflammation. Nasal mucosa are pink and moist without lesions or exudates. Mouth:  Oral mucosa and  oropharynx without lesions or exudates.  Teeth in good repair. Neck:  No deformities, masses, or tenderness noted. Chest Wall:  portacath is palpable Lungs:  Normal respiratory effort, chest expands symmetrically. Lungs are clear to auscultation, no crackles or wheezes. Heart:  Normal rate and regular rhythm. S1 and S2 normal without gallop, murmur, click, rub or other extra sounds. Abdomen:  S/NT Msk:  No deformity or scoliosis noted of thoracic or lumbar spine.   Extremities:  No clubbing, cyanosis, edema, or deformity noted with normal full range of motion of all joints.   Neurologic:  No cranial nerve deficits noted. Station and gait are normal. Plantar reflexes are down-going bilaterally. DTRs are symmetrical throughout. Sensory, motor and coordinative functions appear intact. Skin:  Intact without suspicious lesions or rashes Psych:  Cognition and judgment appear intact. Alert and cooperative with normal attention span and concentration. No apparent delusions, illusions, hallucinations   Impression & Recommendations:  Problem # 1:  HYPERTENSION (ICD-401.9) Assessment Improved  Her updated medication list for this problem includes:    Atenolol-chlorthalidone 50-25 Mg Tabs (Atenolol-chlorthalidone) .Marland Kitchen... 1 once daily    Amlodipine Besylate 5 Mg Tabs (Amlodipine besylate) .Marland Kitchen... 1 by mouth qd  BP today: 118/70 Prior BP: 140/90 (04/27/2009)  Labs Reviewed: K+: 3.4 (12/18/2008) Creat: : 0.7 (12/18/2008)   Chol: 218 (12/18/2008)   HDL: 97.20 (12/18/2008)   TG: 82.0 (12/18/2008)  Problem # 2:  COLON CANCER, HX OF (ICD-V10.05) Assessment: Improved F/u with Dr Myna Hidalgo Colonosc is pending   Problem # 3:  ALCOHOLISM (ICD-303.90) Assessment: Unchanged Discussed  Problem # 4:  TOBACCO USER (ICD-305.1) Assessment: Unchanged  Encouraged smoking cessation and discussed different methods for smoking cessation.   Problem # 5:  TOBACCO USE DISORDER/SMOKER-SMOKING CESSATION DISCUSSED  (ICD-305.1) Assessment: Unchanged  Encouraged smoking cessation and discussed different methods for smoking cessation.   Complete Medication List: 1)  Atenolol-chlorthalidone 50-25 Mg Tabs (Atenolol-chlorthalidone) .Marland Kitchen.. 1 once daily 2)  Vitamin D3 1000 Unit Tabs (Cholecalciferol) .... 2 by mouth daily 3)  Aspirin 81 Mg Tbec (Aspirin) .... One by mouth every day 4)  Vitamin B-1 100 Mg Tabs (Thiamine hcl) .Marland Kitchen.. 1 by mouth qd 5)  Amlodipine Besylate 5 Mg Tabs (Amlodipine besylate) .Marland Kitchen.. 1 by mouth qd 6)  Gabapentin 300 Mg Caps (Gabapentin) .Marland Kitchen.. 1 four times a day  Patient Instructions: 1)  Please schedule a follow-up appointment in 6 months well w/labs. Prescriptions: GABAPENTIN 300 MG CAPS (GABAPENTIN) 1 four times a day  #180 x 3   Entered and Authorized by:   Tresa Garter MD   Signed by:   Tresa Garter MD on 01/24/2010   Method used:   Print then Give to Patient   RxID:   [id number] AMLODIPINE BESYLATE 5 MG TABS (AMLODIPINE BESYLATE) 1 by mouth qd  #90 x 3   Entered and Authorized by:   Tresa Garter MD   Signed by:   Tresa Garter MD on 01/24/2010   Method used:   Print then Give to Patient   RxID:   [id number] VITAMIN B-1 100 MG TABS (THIAMINE HCL) 1 by mouth qd  #100 x 3   Entered and Authorized by:   Tresa Garter MD   Signed by:   Tresa Garter MD on 01/24/2010   Method used:   Print then Give to Patient   RxID:   [id number] ASPIRIN 81 MG  TBEC (ASPIRIN) one by mouth every day  #100 x 3   Entered and Authorized by:   Tresa Garter MD   Signed by:   Tresa Garter MD on 01/24/2010   Method used:   Print then Give to Patient   RxID:   [id number] VITAMIN D3 1000 UNIT  TABS (CHOLECALCIFEROL) 2 by mouth daily  #100 x 3   Entered and Authorized by:   Tresa Garter MD   Signed by:   Tresa Garter MD on 01/24/2010   Method used:   Print then Give to Patient   RxID:    [id number] ATENOLOL-CHLORTHALIDONE 50-25 MG  TABS (ATENOLOL-CHLORTHALIDONE) 1 once daily  #90 x 3   Entered and Authorized by:   Tresa Garter MD   Signed by:   Tresa Garter MD on 01/24/2010   Method used:   Print then Give to Patient   RxID:   [id number]    Not Administered:    Influenza Vaccine not given due to: declined

## 2010-05-31 NOTE — Letter (Signed)
Summary: Patient Notice- Polyp Results  [facility]  [address] [place], [place] [address]   Phone: [phone]  Fax: [phone]        December 29, 2009 MRN: [id number]    [facility] [address] [place], [place]  [address]    Dear Diana Barker,  I am pleased to inform you that the colon polyp(s) removed during your recent colonoscopy was (were) found to be benign (no cancer detected) upon pathologic examination.  I recommend you have a repeat colonoscopy examination in 3_ years to look for recurrent polyps, as having colon polyps increases your risk for having recurrent polyps or even colon cancer in the future.  Should you develop new or worsening symptoms of abdominal pain, bowel habit changes or bleeding from the rectum or bowels, please schedule an evaluation with either your primary care physician or with me.  Additional information/recommendations:  XX__ No further action with gastroenterology is needed at this time. Please      follow-up with your primary care physician for your other healthcare      needs.  __ Please call [phone] to schedule a return visit to review your      situation.  __ Please keep your follow-up visit as already scheduled.  __ Continue treatment plan as outlined the day of your exam.  Please call us if you are having persistent problems or have questions about your condition that have not been fully answered at this time.  Sincerely,  Mardella Layman MD [facility]  This letter has been electronically signed by your physician.  Appended Document: Patient Notice- Polyp Results letter mailed 9.2.11

## 2010-06-02 NOTE — Letter (Signed)
Summary: [facility]  [facility]   Imported By: Lester [place] 05/04/2010 09:13:00  _____________________________________________________________________  External Attachment:    Type:   Image     Comment:   External Document

## 2010-06-08 NOTE — Letter (Signed)
Summary: [facility]  [facility]   Imported By: Sherian Rein 06/03/2010 11:09:50  _____________________________________________________________________  External Attachment:    Type:   Image     Comment:   External Document

## 2010-07-14 LAB — CBC
HCT: 44.8 % (ref 36.0–46.0)
MCH: 31.8 pg (ref 26.0–34.0)
MCHC: 33.9 g/dL (ref 30.0–36.0)
MCV: 93.7 fL (ref 78.0–100.0)
RDW: 14.3 % (ref 11.5–15.5)

## 2010-08-04 LAB — CBC
MCHC: 32.5 g/dL (ref 30.0–36.0)
RDW: 13.9 % (ref 11.5–15.5)

## 2010-08-05 LAB — BASIC METABOLIC PANEL
CO2: 31 mEq/L (ref 19–32)
CO2: 37 mEq/L — ABNORMAL HIGH (ref 19–32)
Calcium: 8.5 mg/dL (ref 8.4–10.5)
Calcium: 8.8 mg/dL (ref 8.4–10.5)
Chloride: 93 mEq/L — ABNORMAL LOW (ref 96–112)
Creatinine, Ser: 0.49 mg/dL (ref 0.4–1.2)
Creatinine, Ser: 0.5 mg/dL (ref 0.4–1.2)
GFR calc Af Amer: >60 mL/min (ref >60)
GFR calc Af Amer: >60 mL/min (ref >60)
GFR calc non Af Amer: >60 mL/min (ref >60)
Glucose, Bld: 107 mg/dL — ABNORMAL HIGH (ref 70–99)
Glucose, Bld: 130 mg/dL — ABNORMAL HIGH (ref 70–99)
Sodium: 137 mEq/L (ref 135–145)

## 2010-08-05 LAB — COMPREHENSIVE METABOLIC PANEL
ALT: 21 U/L (ref 0–35)
ALT: 29 U/L (ref 0–35)
AST: 33 U/L (ref 0–37)
Albumin: 2.6 g/dL — ABNORMAL LOW (ref 3.5–5.2)
Alkaline Phosphatase: 52 U/L (ref 39–117)
CO2: 27 mEq/L (ref 19–32)
Calcium: 8 mg/dL — ABNORMAL LOW (ref 8.4–10.5)
Calcium: 9.1 mg/dL (ref 8.4–10.5)
Chloride: 99 mEq/L (ref 96–112)
GFR calc Af Amer: >60 mL/min (ref >60)
GFR calc non Af Amer: >60 mL/min (ref >60)
Glucose, Bld: 109 mg/dL — ABNORMAL HIGH (ref 70–99)
Potassium: 3.4 mEq/L — ABNORMAL LOW (ref 3.5–5.1)
Sodium: 136 mEq/L (ref 135–145)
Sodium: 137 mEq/L (ref 135–145)
Total Bilirubin: 1.4 mg/dL — ABNORMAL HIGH (ref 0.3–1.2)
Total Protein: 5.6 g/dL — ABNORMAL LOW (ref 6.0–8.3)

## 2010-08-05 LAB — CBC
Hemoglobin: 11.2 g/dL — ABNORMAL LOW (ref 12.0–15.0)
Hemoglobin: 11.6 g/dL — ABNORMAL LOW (ref 12.0–15.0)
Hemoglobin: 12.4 g/dL (ref 12.0–15.0)
Hemoglobin: 15.4 g/dL — ABNORMAL HIGH (ref 12.0–15.0)
MCHC: 32.8 g/dL (ref 30.0–36.0)
MCHC: 33 g/dL (ref 30.0–36.0)
MCHC: 33.2 g/dL (ref 30.0–36.0)
MCHC: 33.3 g/dL (ref 30.0–36.0)
MCV: 93.9 fL (ref 78.0–100.0)
MCV: 95 fL (ref 78.0–100.0)
Platelets: 208 10*3/uL (ref 150–400)
RBC: 3.56 MIL/uL — ABNORMAL LOW (ref 3.87–5.11)
RBC: 3.75 MIL/uL — ABNORMAL LOW (ref 3.87–5.11)
RBC: 4.92 MIL/uL (ref 3.87–5.11)
RDW: 13.4 % (ref 11.5–15.5)
RDW: 13.4 % (ref 11.5–15.5)
RDW: 13.9 % (ref 11.5–15.5)
WBC: 10.4 10*3/uL (ref 4.0–10.5)
WBC: 4.9 10*3/uL (ref 4.0–10.5)

## 2010-08-05 LAB — DIFFERENTIAL
Basophils Absolute: 0 10*3/uL (ref 0.0–0.1)
Basophils Relative: 0 % (ref 0–1)
Eosinophils Absolute: 0 10*3/uL (ref 0.0–0.7)
Eosinophils Relative: 0 % (ref 0–5)
Lymphs Abs: 1.4 10*3/uL (ref 0.7–4.0)
Neutrophils Relative %: 85 % — ABNORMAL HIGH (ref 43–77)

## 2010-08-05 LAB — URINE MICROSCOPIC-ADD ON

## 2010-08-05 LAB — URINALYSIS, ROUTINE W REFLEX MICROSCOPIC
Ketones, ur: 15 mg/dL — AB
Nitrite: NEGATIVE
Protein, ur: NEGATIVE mg/dL

## 2010-08-05 LAB — CEA: CEA: 4.3 ng/mL (ref 0.0–5.0)

## 2010-08-05 LAB — LACTIC ACID, PLASMA: Lactic Acid, Venous: 1.4 mmol/L (ref 0.5–2.2)

## 2010-08-05 LAB — LIPASE, BLOOD: Lipase: 11 U/L (ref 11–59)

## 2010-09-11 IMAGING — CT CT PELVIS W/ CM
1 of 4 series · 13 of 32 positions shown, 18 images · IV contrast (agent unspecified)
Comparison: 12/01/2008.

CT ABDOMEN

CLINICAL DATA: Lower abdominal pain for the past 3 days.  Nausea,
vomiting and diarrhea.

CT ABDOMEN AND PELVIS WITH CONTRAST
TECHNIQUE: Multidetector CT imaging of the abdomen and pelvis was
performed using the standard protocol following bolus
administration of intravenous contrast.
Contrast: 100 ml [id number]

[Series 2: rtn ap with st · axial · 0.68mm/px · z∈[-424,-74]mm · 13 of 81 slices shown, 18 images]
[im 6/81  soft-tissue]
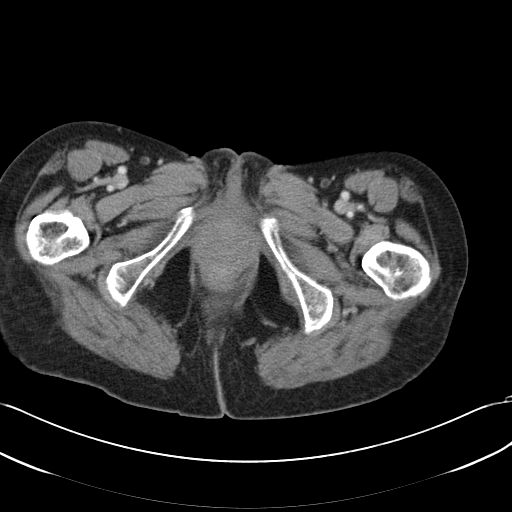
[im 6/81  bone]
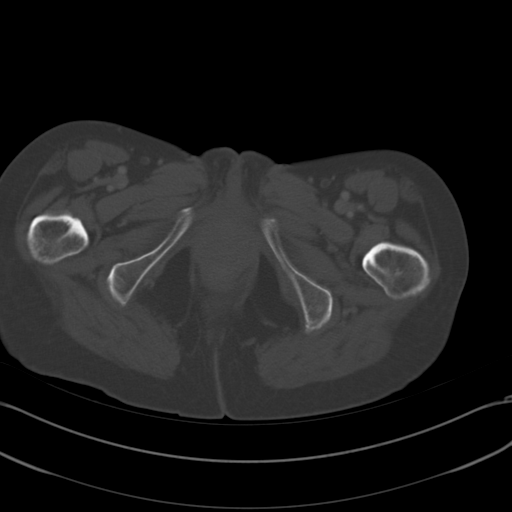
[im 11/81  soft-tissue]
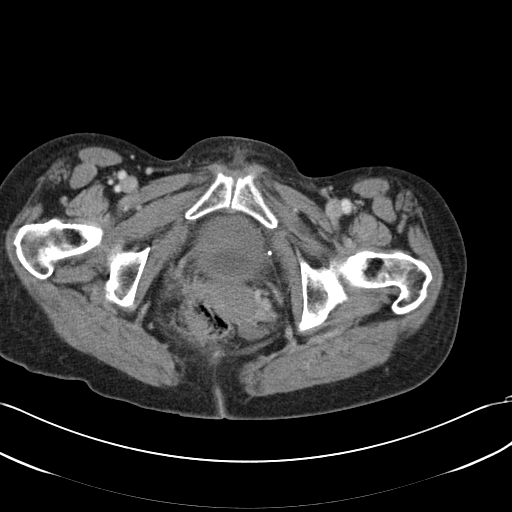
[im 21/81  soft-tissue]
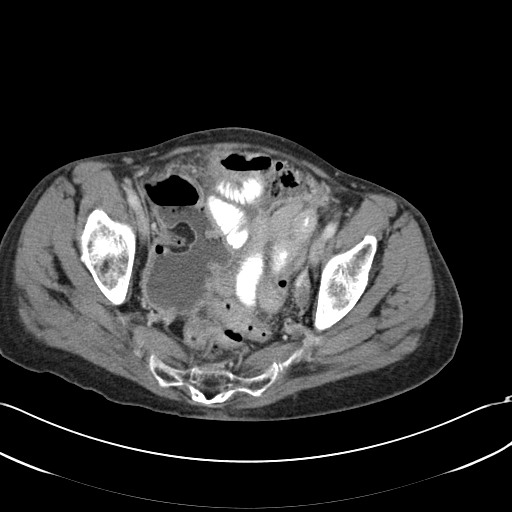
[im 26/81  soft-tissue]
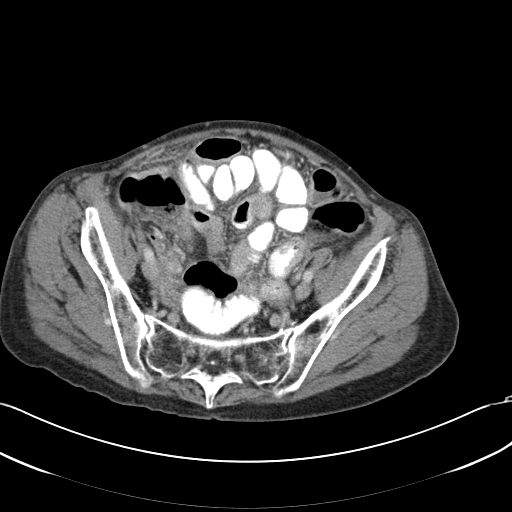
[im 31/81  soft-tissue]
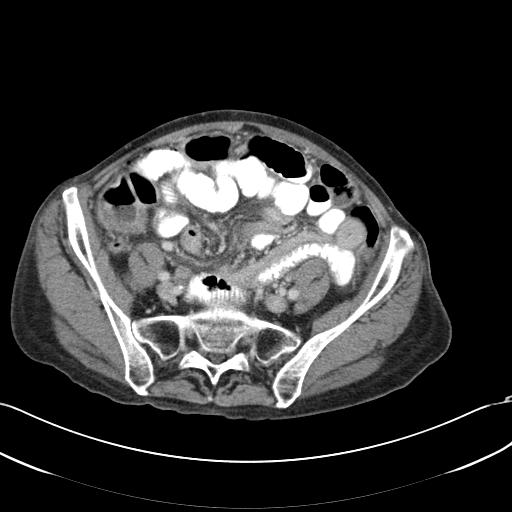
[im 36/81  soft-tissue]
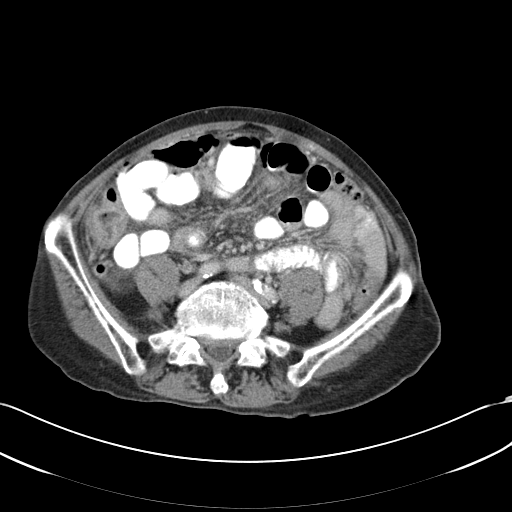
[im 46/81  soft-tissue]
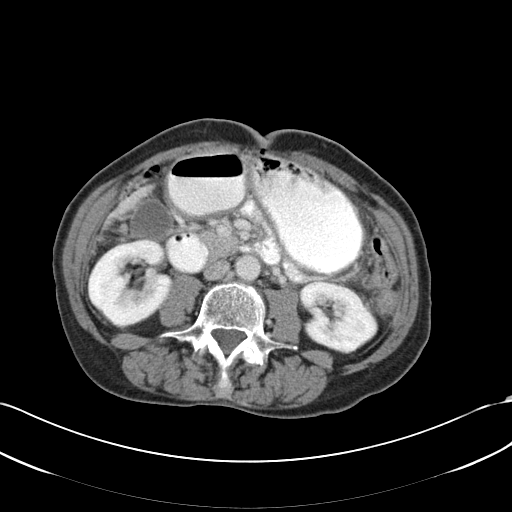
[im 51/81  soft-tissue]
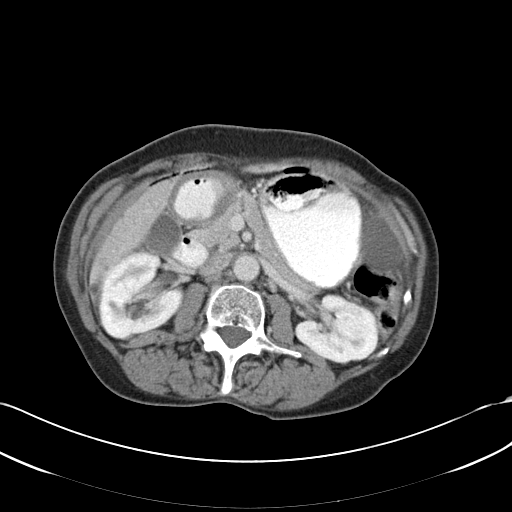
[im 56/81  soft-tissue]
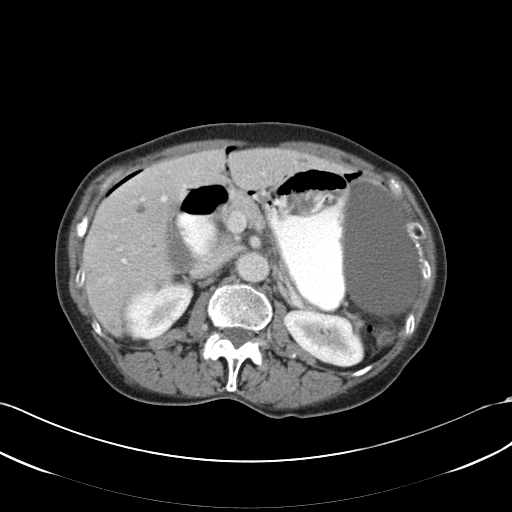
[im 56/81  bone]
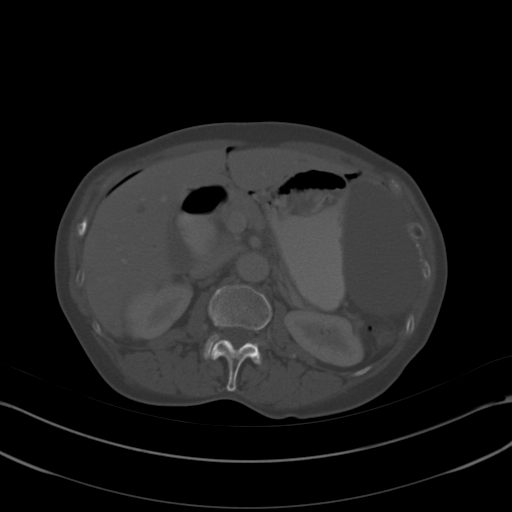
[im 61/81  soft-tissue]
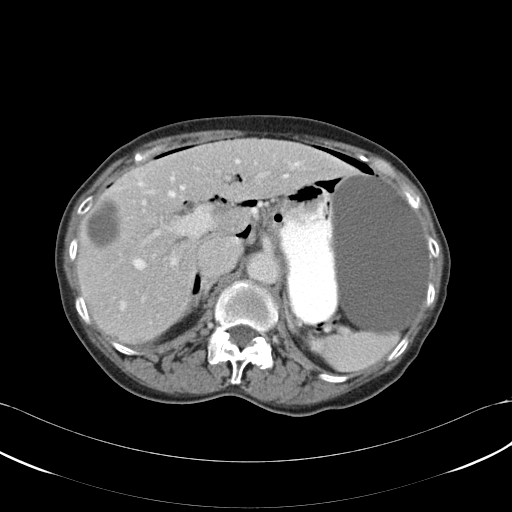
[im 61/81  lung]
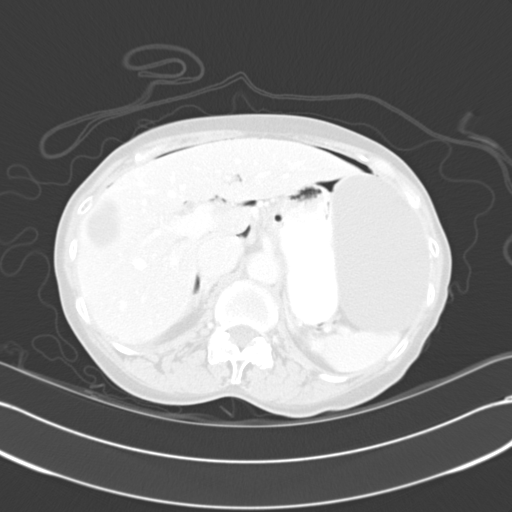
[im 66/81  lung]
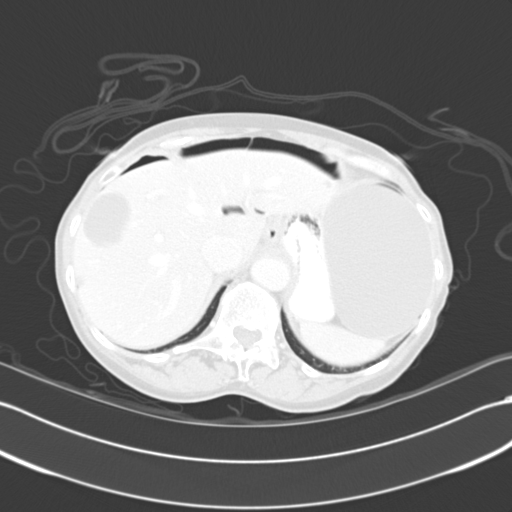
[im 71/81  soft-tissue]
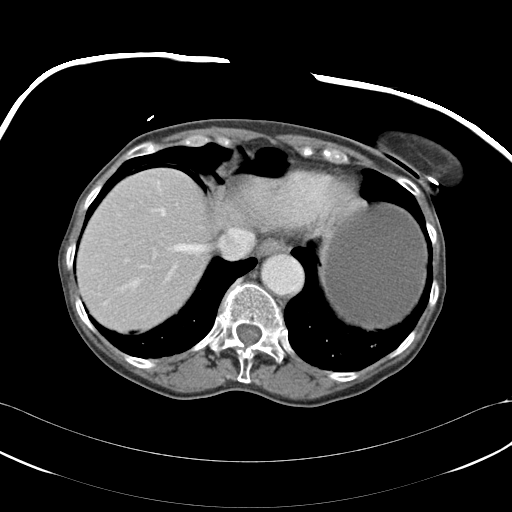
[im 71/81  lung]
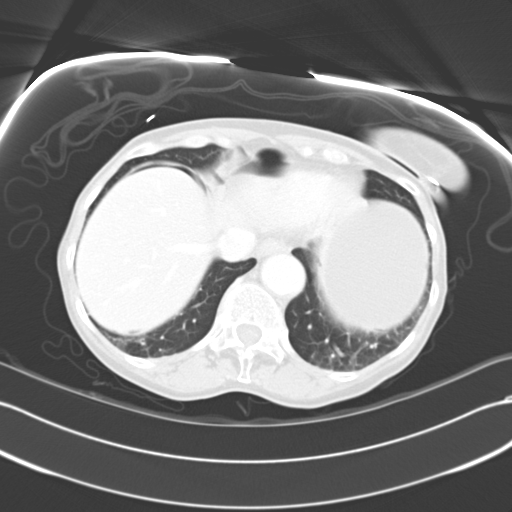
[im 76/81  soft-tissue]
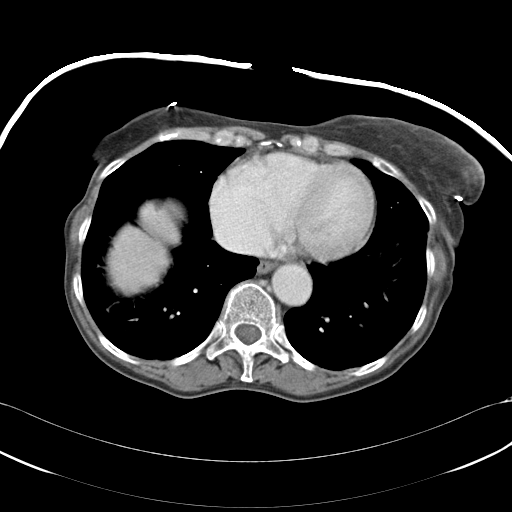
[im 76/81  lung]
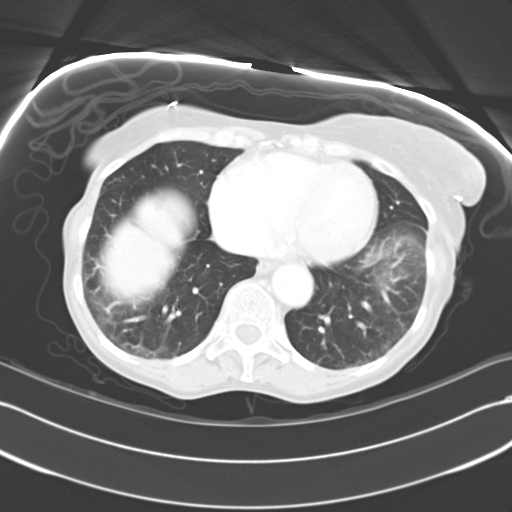

[13 of 32 positions shown; findings below may reference images not displayed]

FINDINGS: Small to moderate amount of free peritoneal air.  Mild
diffuse low density gastric antrum wall thickening with mild
progression.  This measures 5.6 mm in maximum thickness.  No
significant change in multiple liver cysts.  The largest is an
exophytic cyst arising from the lateral aspect of the left lobe,
measuring 10.8 cm in maximum diameter and displacing the stomach to
the right.  Multiple small bilateral renal cysts.  Unremarkable
gallbladder, pancreas, adrenal glands and spleen.  Multiple mildly
dilated small bowel loops.  No enlarged lymph nodes.  Minimal
atelectasis at the lung bases. Minimal free peritoneal fluid.  Mild
lumbar and lower thoracic spine degenerative changes.
IMPRESSION: 1.  Small moderate amount of free peritoneal air and minimal free
peritoneal fluid, compatible with bowel perforation.  This could be
due to a perforated ulcer or perforated small bowel.
2.  Mild diffuse low density gastric antrum wall thickening,
suggesting gastritis.
3.  Mild small bowel ileus or partial obstruction.
4.  No significant change in multiple hepatic cysts.

CT PELVIS
FINDINGS: Multiple mildly to moderately dilated small bowel loops.
Fluid filled cecum.  Normal appearing retrocecal appendix.  No
visible colonic diverticula or evidence of diverticulitis.  Low
density uterus.  1.4 cm left ovarian cyst.  Adjacent 5 mm left
ovarian cyst.  Small amount of free peritoneal air.  No free
peritoneal fluid seen in the pelvis.  Diffuse osteopenia.
IMPRESSION: 1.  Mild to moderate small bowel obstruction or ileus.
2.  Small amount of free peritoneal air, compatible with bowel
perforation.
3.  1.4 cm and 5 mm left ovarian cysts.

Critical test results telephoned to Dr. Kirve at the time of
interpretation on 01/01/2009 at 6695 hours.

## 2010-10-13 ENCOUNTER — Telehealth: Payer: Self-pay | Admitting: *Deleted

## 2010-10-13 ENCOUNTER — Encounter: Payer: Self-pay | Admitting: *Deleted

## 2010-10-13 ENCOUNTER — Other Ambulatory Visit (INDEPENDENT_AMBULATORY_CARE_PROVIDER_SITE_OTHER): Payer: Medicare Other

## 2010-10-13 ENCOUNTER — Ambulatory Visit (INDEPENDENT_AMBULATORY_CARE_PROVIDER_SITE_OTHER): Payer: Medicare Other | Admitting: Internal Medicine

## 2010-10-13 DIAGNOSIS — F1020 Alcohol dependence, uncomplicated: Secondary | ICD-10-CM

## 2010-10-13 DIAGNOSIS — R209 Unspecified disturbances of skin sensation: Secondary | ICD-10-CM

## 2010-10-13 DIAGNOSIS — R42 Dizziness and giddiness: Secondary | ICD-10-CM

## 2010-10-13 DIAGNOSIS — Z85038 Personal history of other malignant neoplasm of large intestine: Secondary | ICD-10-CM

## 2010-10-13 DIAGNOSIS — R202 Paresthesia of skin: Secondary | ICD-10-CM

## 2010-10-13 LAB — COMPREHENSIVE METABOLIC PANEL
ALT: 43 U/L — ABNORMAL HIGH (ref 0–35)
AST: 51 U/L — ABNORMAL HIGH (ref 0–37)
Alkaline Phosphatase: 70 U/L (ref 39–117)
BUN: 15 mg/dL (ref 6–23)
Calcium: 9.5 mg/dL (ref 8.4–10.5)
Creatinine, Ser: 0.7 mg/dL (ref 0.4–1.2)
Total Bilirubin: 0.8 mg/dL (ref 0.3–1.2)

## 2010-10-13 LAB — CBC WITH DIFFERENTIAL/PLATELET
Basophils Relative: 0.6 % (ref 0.0–3.0)
Eosinophils Absolute: 0 10*3/uL (ref 0.0–0.7)
HCT: 45 % (ref 36.0–46.0)
Hemoglobin: 14.6 g/dL (ref 12.0–15.0)
Lymphocytes Relative: 55.5 % — ABNORMAL HIGH (ref 12.0–46.0)
Lymphs Abs: 3 10*3/uL (ref 0.7–4.0)
MCHC: 32.5 g/dL (ref 30.0–36.0)
MCV: 99.5 fl (ref 78.0–100.0)
Neutro Abs: 1.8 10*3/uL (ref 1.4–7.7)
RBC: 4.53 Mil/uL (ref 3.87–5.11)
RDW: 14.2 % (ref 11.5–14.6)

## 2010-10-13 LAB — SEDIMENTATION RATE: Sed Rate: 4 mm/hr (ref 0–22)

## 2010-10-13 LAB — VITAMIN B12: Vitamin B-12: 433 pg/mL (ref 211–911)

## 2010-10-13 MED ORDER — MECLIZINE HCL 12.5 MG PO TABS: 12.5000 mg | ORAL_TABLET | Freq: Three times a day (TID) | ORAL | Status: AC | PRN

## 2010-10-13 MED ORDER — PREDNISONE 10 MG PO TABS: ORAL_TABLET | ORAL | Status: AC

## 2010-10-13 MED ORDER — B COMPLEX PO TABS: 1.0000 | ORAL_TABLET | Freq: Every day | ORAL | Status: DC

## 2010-10-13 NOTE — Assessment & Plan Note (Signed)
Brain CT if sx  not gone

## 2010-10-13 NOTE — Progress Notes (Signed)
  Subjective:    Patient ID: Diana Barker, female    DOB: 11-13-1945, 65 y.o.   MRN: [id number]  HPI  C/o dizziness off and on x 2 wks - severe sx's on a couple occasions Movement is making it worse. No syncope, HA, LOC, CP  Review of Systems  Constitutional: Negative for chills, activity change, appetite change, fatigue and unexpected weight change.  HENT: Negative for congestion, mouth sores and sinus pressure.   Eyes: Negative for visual disturbance.  Respiratory: Negative for cough and chest tightness.   Cardiovascular: Negative for chest pain and palpitations.  Gastrointestinal: Negative for nausea and abdominal pain.  Genitourinary: Negative for frequency, difficulty urinating and vaginal pain.  Musculoskeletal: Negative for back pain and gait problem.  Skin: Negative for pallor and rash.  Neurological: Positive for dizziness. Negative for tremors, seizures, syncope, speech difficulty, weakness, light-headedness, numbness and headaches.  Psychiatric/Behavioral: Negative for confusion, sleep disturbance and agitation.       Objective:   Physical Exam  Constitutional: She is oriented to person, place, and time. She appears well-developed and well-nourished. No distress.       Thin, NAD  HENT:  Head: Normocephalic.  Right Ear: External ear normal.  Left Ear: External ear normal.  Nose: Nose normal.  Mouth/Throat: Oropharynx is clear and moist.  Eyes: Conjunctivae are normal. Pupils are equal, round, and reactive to light. Right eye exhibits no discharge. Left eye exhibits no discharge.       B arcus senilis  Neck: Normal range of motion. Neck supple. No JVD present. No tracheal deviation present. No thyromegaly present.  Cardiovascular: Normal rate, regular rhythm and normal heart sounds.   Pulmonary/Chest: No stridor. No respiratory distress. She has no wheezes.  Abdominal: Soft. Bowel sounds are normal. She exhibits no distension and no mass. There is no tenderness.  There is no rebound and no guarding.  Musculoskeletal: She exhibits no edema and no tenderness.  Lymphadenopathy:    She has no cervical adenopathy.  Neurological: She is oriented to person, place, and time. She has normal reflexes. She displays normal reflexes. No cranial nerve deficit. She exhibits normal muscle tone. Coordination normal.       H-P (-) B  Skin: Skin is warm. No rash noted. No erythema.  Psychiatric: She has a normal mood and affect. Her behavior is normal. Judgment and thought content normal.        Lab Results  Component Value Date   WBC 5.5 10/13/2010   HGB 14.6 10/13/2010   HCT 45.0 10/13/2010   PLT 204.0 10/13/2010   CHOL 218* 12/18/2008   TRIG 82.0 12/18/2008   HDL 97.20 12/18/2008   LDLDIRECT 101.6 12/18/2008   ALT 43* 10/13/2010   AST 51* 10/13/2010   NA 142 10/13/2010   K 4.0 10/13/2010   CL 102 10/13/2010   CREATININE 0.7 10/13/2010   BUN 15 10/13/2010   CO2 31 10/13/2010   TSH 0.45 10/13/2010   INR 0.9 ratio 12/18/2008     Assessment & Plan:

## 2010-10-13 NOTE — Patient Instructions (Signed)
Do not drink alcohol To ER if worse

## 2010-10-13 NOTE — Telephone Encounter (Signed)
Abstract meds for OV.

## 2010-10-13 NOTE — Assessment & Plan Note (Signed)
Stop beer Vit B complex Meclizine

## 2010-10-13 NOTE — Assessment & Plan Note (Signed)
Stop ETOH

## 2010-10-18 ENCOUNTER — Other Ambulatory Visit: Payer: Self-pay | Admitting: Internal Medicine

## 2010-10-26 ENCOUNTER — Encounter: Payer: Self-pay | Admitting: Internal Medicine

## 2010-10-27 ENCOUNTER — Ambulatory Visit: Payer: Medicare Other | Admitting: Internal Medicine

## 2010-12-14 ENCOUNTER — Emergency Department (HOSPITAL_COMMUNITY)
Admission: EM | Admit: 2010-12-14 | Discharge: 2010-12-14 | Disposition: A | Discharge status: Home / Self Care | Payer: Medicare Other | Attending: Emergency Medicine | Admitting: Emergency Medicine

## 2010-12-14 DIAGNOSIS — K859 Acute pancreatitis without necrosis or infection, unspecified: Secondary | ICD-10-CM | POA: Insufficient documentation

## 2010-12-14 DIAGNOSIS — R1013 Epigastric pain: Secondary | ICD-10-CM | POA: Insufficient documentation

## 2010-12-14 DIAGNOSIS — Z85038 Personal history of other malignant neoplasm of large intestine: Secondary | ICD-10-CM | POA: Insufficient documentation

## 2010-12-14 DIAGNOSIS — I10 Essential (primary) hypertension: Secondary | ICD-10-CM | POA: Insufficient documentation

## 2010-12-14 LAB — CBC
Hemoglobin: 14.3 g/dL (ref 12.0–15.0)
MCH: 31.8 pg (ref 26.0–34.0)
MCHC: 34.1 g/dL (ref 30.0–36.0)
MCV: 93.3 fL (ref 78.0–100.0)
Platelets: 251 10*3/uL (ref 150–400)
RBC: 4.49 MIL/uL (ref 3.87–5.11)

## 2010-12-14 LAB — COMPREHENSIVE METABOLIC PANEL
Alkaline Phosphatase: 75 U/L (ref 39–117)
BUN: 20 mg/dL (ref 6–23)
Chloride: 97 mEq/L (ref 96–112)
Creatinine, Ser: 0.67 mg/dL (ref 0.50–1.10)
GFR calc Af Amer: >60 mL/min (ref >60)
Glucose, Bld: 99 mg/dL (ref 70–99)
Potassium: 2.9 mEq/L — ABNORMAL LOW (ref 3.5–5.1)
Total Bilirubin: 0.4 mg/dL (ref 0.3–1.2)

## 2010-12-14 LAB — ETHANOL: Alcohol, Ethyl (B): <11 mg/dL (ref 0–11)

## 2010-12-15 ENCOUNTER — Telehealth: Payer: Self-pay | Admitting: *Deleted

## 2010-12-15 NOTE — Telephone Encounter (Signed)
Per Dr. Posey Rea: pt can come in fof OV today at 1pm.  I left a detailed mess informing pt of this and advised her to call me back if she can come at 1 pm today.

## 2010-12-15 NOTE — Telephone Encounter (Signed)
Called pt to advise of below. No answer

## 2010-12-15 NOTE — Telephone Encounter (Signed)
Pt called this am stating she was seen in ER lastnight for CP. They dx acute pancreatitis and advised her to f/u with PCP ASAP. I advised pt Dr. Posey Rea has no openings today and offered 2:30 pm appt with him tom . She states she cant wait until then to see him and said " I will just show up in the morning and hope somebody cancels." I advised her she cant just show up without being scheduled with a MD and she said "well you better put me down because I'm coming, bye." and she hung up the phone.  Will you see her sooner than 2:30 pm tomorrow (Friday)?

## 2010-12-16 ENCOUNTER — Encounter: Payer: Self-pay | Admitting: Internal Medicine

## 2010-12-16 ENCOUNTER — Ambulatory Visit (INDEPENDENT_AMBULATORY_CARE_PROVIDER_SITE_OTHER): Payer: Medicare Other | Admitting: Internal Medicine

## 2010-12-16 DIAGNOSIS — K859 Acute pancreatitis without necrosis or infection, unspecified: Secondary | ICD-10-CM

## 2010-12-16 DIAGNOSIS — E876 Hypokalemia: Secondary | ICD-10-CM

## 2010-12-16 DIAGNOSIS — R109 Unspecified abdominal pain: Secondary | ICD-10-CM

## 2010-12-16 DIAGNOSIS — I10 Essential (primary) hypertension: Secondary | ICD-10-CM

## 2010-12-16 DIAGNOSIS — F1020 Alcohol dependence, uncomplicated: Secondary | ICD-10-CM

## 2010-12-16 DIAGNOSIS — Q446 Cystic disease of liver: Secondary | ICD-10-CM | POA: Insufficient documentation

## 2010-12-16 DIAGNOSIS — K7689 Other specified diseases of liver: Secondary | ICD-10-CM

## 2010-12-16 DIAGNOSIS — K852 Alcohol induced acute pancreatitis without necrosis or infection: Secondary | ICD-10-CM

## 2010-12-16 MED ORDER — POTASSIUM CHLORIDE CRYS ER 20 MEQ PO TBCR: 20.0000 meq | EXTENDED_RELEASE_TABLET | Freq: Two times a day (BID) | ORAL | Status: DC

## 2010-12-16 MED ORDER — OMEPRAZOLE MAGNESIUM 20 MG PO TBEC: 20.0000 mg | DELAYED_RELEASE_TABLET | Freq: Every day | ORAL | Status: DC

## 2010-12-16 MED ORDER — VITAMIN B-1 100 MG PO TABS: 100.0000 mg | ORAL_TABLET | Freq: Every day | ORAL | Status: DC

## 2010-12-16 MED ORDER — PANCRELIPASE (LIP-PROT-AMYL) 24000-76000 UNITS PO CPEP: 1.0000 | ORAL_CAPSULE | Freq: Three times a day (TID) | ORAL | Status: DC

## 2010-12-16 NOTE — Patient Instructions (Signed)
Creon 1 capsule 3 times a day with food Prilosec 1 a day in am No alcohol! Low fat diet

## 2010-12-16 NOTE — Telephone Encounter (Signed)
Pt scheduled 12-16-10 at 2:30. Closing phone note.

## 2010-12-25 DIAGNOSIS — K859 Acute pancreatitis without necrosis or infection, unspecified: Secondary | ICD-10-CM | POA: Insufficient documentation

## 2010-12-25 DIAGNOSIS — E876 Hypokalemia: Secondary | ICD-10-CM | POA: Insufficient documentation

## 2010-12-25 NOTE — Assessment & Plan Note (Signed)
Discussed. No alcohol

## 2010-12-25 NOTE — Assessment & Plan Note (Signed)
See discussion. 

## 2010-12-25 NOTE — Progress Notes (Signed)
  Subjective:    Patient ID: Diana Barker, female    DOB: [birth year], 65 y.o.   MRN: [id number]  HPI  The patient presents for a follow-up of recent pancreatitis, ETOH,  chronic hypertension. She is s/p recent ER visit for abd pain  Review of Systems  Constitutional: Negative for chills, activity change, appetite change, fatigue and unexpected weight change.  HENT: Negative for congestion, mouth sores and sinus pressure.   Eyes: Negative for visual disturbance.  Respiratory: Negative for cough and chest tightness.   Gastrointestinal: Negative for nausea and abdominal pain.  Genitourinary: Negative for frequency, difficulty urinating and vaginal pain.  Musculoskeletal: Negative for back pain and gait problem.  Skin: Negative for pallor and rash.  Neurological: Negative for dizziness, tremors, weakness, numbness and headaches.  Psychiatric/Behavioral: Negative for confusion and sleep disturbance.       Objective:   Physical Exam  Constitutional: She appears well-developed. No distress.       Thin  HENT:  Head: Normocephalic.  Right Ear: External ear normal.  Left Ear: External ear normal.  Nose: Nose normal.  Mouth/Throat: Oropharynx is clear and moist.  Eyes: Conjunctivae are normal. Pupils are equal, round, and reactive to light. Right eye exhibits no discharge. Left eye exhibits no discharge.  Neck: Normal range of motion. Neck supple. No JVD present. No tracheal deviation present. No thyromegaly present.  Cardiovascular: Normal rate, regular rhythm and normal heart sounds.   Pulmonary/Chest: No stridor. No respiratory distress. She has no wheezes.  Abdominal: Soft. Bowel sounds are normal. She exhibits no distension and no mass. There is no tenderness. There is no rebound and no guarding.  Musculoskeletal: She exhibits no edema and no tenderness.  Lymphadenopathy:    She has no cervical adenopathy.  Neurological: She displays normal reflexes. No cranial nerve deficit. She  exhibits normal muscle tone. Coordination normal.  Skin: No rash noted. No erythema.  Psychiatric: She has a normal mood and affect. Her behavior is normal. Judgment and thought content normal.    Lab Results  Component Value Date   WBC 5.6 12/14/2010   HGB 14.3 12/14/2010   HCT 41.9 12/14/2010   PLT 251 12/14/2010   CHOL 218* 12/18/2008   TRIG 82.0 12/18/2008   HDL 97.20 12/18/2008   LDLDIRECT 101.6 12/18/2008   ALT 41* 12/14/2010   AST 47* 12/14/2010   NA 138 12/14/2010   K 2.9* 12/14/2010   CL 97 12/14/2010   CREATININE 0.67 12/14/2010   BUN 20 12/14/2010   CO2 31 12/14/2010   TSH 0.45 10/13/2010   INR 0.9 ratio 12/18/2008         Assessment & Plan:    ER notes and tests reviewed. High risk of morbidity and even mortality if she cont. To drink was discussed and aknowledged.

## 2010-12-25 NOTE — Assessment & Plan Note (Signed)
Must stop all ETOH 8/12 Thiamine qd AA

## 2010-12-25 NOTE — Assessment & Plan Note (Signed)
Treated

## 2011-01-03 ENCOUNTER — Ambulatory Visit: Payer: Medicare Other | Admitting: Internal Medicine

## 2011-01-09 ENCOUNTER — Ambulatory Visit (INDEPENDENT_AMBULATORY_CARE_PROVIDER_SITE_OTHER): Payer: Medicare Other | Admitting: Internal Medicine

## 2011-01-09 ENCOUNTER — Encounter: Payer: Self-pay | Admitting: Internal Medicine

## 2011-01-09 DIAGNOSIS — I10 Essential (primary) hypertension: Secondary | ICD-10-CM

## 2011-01-09 DIAGNOSIS — F1020 Alcohol dependence, uncomplicated: Secondary | ICD-10-CM

## 2011-01-09 DIAGNOSIS — K852 Alcohol induced acute pancreatitis without necrosis or infection: Secondary | ICD-10-CM

## 2011-01-09 DIAGNOSIS — K859 Acute pancreatitis without necrosis or infection, unspecified: Secondary | ICD-10-CM

## 2011-01-09 NOTE — Assessment & Plan Note (Signed)
No ETOH since 8/12. Doing well  Wt Readings from Last 3 Encounters:  01/09/11 112 lb (50.803 kg)  12/16/10 111 lb (50.349 kg)  10/13/10 111 lb (50.349 kg)

## 2011-01-09 NOTE — Assessment & Plan Note (Signed)
Continue with current prescription therapy as reflected on the Med list. BP Readings from Last 3 Encounters:  01/09/11 122/90  12/16/10 140/70  10/13/10 128/84

## 2011-01-09 NOTE — Assessment & Plan Note (Signed)
No ETOH since 8/12

## 2011-01-09 NOTE — Progress Notes (Signed)
  Subjective:    Patient ID: Diana Barker, female    DOB: 03/31/1946, 65 y.o.   MRN: [id number]  HPI  F/u pancreatitis and abd pain (gone) F/u HTN and alcoholism  Review of Systems  Constitutional: Negative for chills, activity change, appetite change, fatigue and unexpected weight change.  HENT: Negative for congestion, mouth sores and sinus pressure.   Eyes: Negative for visual disturbance.  Respiratory: Negative for cough and chest tightness.   Gastrointestinal: Negative for nausea and abdominal pain.  Genitourinary: Negative for frequency, difficulty urinating and vaginal pain.  Musculoskeletal: Negative for back pain and gait problem.  Skin: Negative for pallor and rash.  Neurological: Negative for dizziness, tremors, weakness, numbness and headaches.  Psychiatric/Behavioral: Negative for confusion and sleep disturbance.       Objective:   Physical Exam  Constitutional: She appears well-developed and well-nourished. No distress.  HENT:  Head: Normocephalic.  Right Ear: External ear normal.  Left Ear: External ear normal.  Nose: Nose normal.  Mouth/Throat: Oropharynx is clear and moist.  Eyes: Conjunctivae are normal. Pupils are equal, round, and reactive to light. Right eye exhibits no discharge. Left eye exhibits no discharge.  Neck: Normal range of motion. Neck supple. No JVD present. No tracheal deviation present. No thyromegaly present.  Cardiovascular: Normal rate, regular rhythm and normal heart sounds.   Pulmonary/Chest: No stridor. No respiratory distress. She has no wheezes.  Abdominal: Soft. Bowel sounds are normal. She exhibits no distension and no mass. There is no tenderness. There is no rebound and no guarding.  Musculoskeletal: She exhibits no edema and no tenderness.  Lymphadenopathy:    She has no cervical adenopathy.  Neurological: She displays normal reflexes. No cranial nerve deficit. She exhibits normal muscle tone. Coordination normal.  Skin: No  rash noted. No erythema.  Psychiatric: She has a normal mood and affect. Her behavior is normal. Judgment and thought content normal.          Assessment & Plan:

## 2011-01-11 ENCOUNTER — Encounter: Payer: Self-pay | Admitting: Internal Medicine

## 2011-04-19 ENCOUNTER — Other Ambulatory Visit: Payer: Self-pay | Admitting: Internal Medicine

## 2011-06-19 ENCOUNTER — Ambulatory Visit (INDEPENDENT_AMBULATORY_CARE_PROVIDER_SITE_OTHER): Payer: Medicare Other | Admitting: Internal Medicine

## 2011-06-19 ENCOUNTER — Ambulatory Visit (INDEPENDENT_AMBULATORY_CARE_PROVIDER_SITE_OTHER)
Admission: RE | Admit: 2011-06-19 | Discharge: 2011-06-19 | Disposition: A | Discharge status: Home / Self Care | Payer: Medicare Other | Source: Ambulatory Visit | Attending: Internal Medicine | Admitting: Internal Medicine

## 2011-06-19 ENCOUNTER — Encounter: Payer: Self-pay | Admitting: Internal Medicine

## 2011-06-19 VITALS — BP 120/80 | HR 84 | Temp 97.0°F | Resp 16 | Wt 116.0 lb

## 2011-06-19 DIAGNOSIS — M79609 Pain in unspecified limb: Secondary | ICD-10-CM

## 2011-06-19 DIAGNOSIS — F17200 Nicotine dependence, unspecified, uncomplicated: Secondary | ICD-10-CM

## 2011-06-19 DIAGNOSIS — M545 Low back pain, unspecified: Secondary | ICD-10-CM

## 2011-06-19 DIAGNOSIS — F1020 Alcohol dependence, uncomplicated: Secondary | ICD-10-CM

## 2011-06-19 DIAGNOSIS — K859 Acute pancreatitis without necrosis or infection, unspecified: Secondary | ICD-10-CM

## 2011-06-19 DIAGNOSIS — I10 Essential (primary) hypertension: Secondary | ICD-10-CM

## 2011-06-19 DIAGNOSIS — M79605 Pain in left leg: Secondary | ICD-10-CM

## 2011-06-19 DIAGNOSIS — K852 Alcohol induced acute pancreatitis without necrosis or infection: Secondary | ICD-10-CM

## 2011-06-19 MED ORDER — PREDNISONE 10 MG PO TABS: ORAL_TABLET | ORAL | Status: DC

## 2011-06-19 MED ORDER — TRAMADOL HCL 50 MG PO TABS: 50.0000 mg | ORAL_TABLET | Freq: Two times a day (BID) | ORAL | Status: AC | PRN

## 2011-06-19 NOTE — Assessment & Plan Note (Signed)
1 bottle of wine a day  Potential  potential risks  and complications of drinking (including death from pancreatitis) were explained to the patient and were aknowledged

## 2011-06-19 NOTE — Assessment & Plan Note (Signed)
Continue with current prescription therapy as reflected on the Med list.  

## 2011-06-19 NOTE — Assessment & Plan Note (Signed)
Prednisone 10 mg: take 4 tabs a day x 3 days; then 3 tabs a day x 4 days; then 2 tabs a day x 4 days, then 1 tab a day x 6 days, then stop. Take pc. LS spine x ray Stretch

## 2011-06-19 NOTE — Assessment & Plan Note (Addendum)
1 bottle of wine a day  Potential  potential risks  and complications of drinking (including death from pancreatitis) were explained to the patient and were aknowledged. She knows she must stop drinking and start visiting AA

## 2011-06-19 NOTE — Assessment & Plan Note (Signed)
Chronic - needs to stop

## 2011-06-19 NOTE — Patient Instructions (Signed)
Stretch Rest

## 2011-06-19 NOTE — Assessment & Plan Note (Signed)
Prednisone 10 mg: take 4 tabs a day x 3 days; then 3 tabs a day x 4 days; then 2 tabs a day x 4 days, then 1 tab a day x 6 days, then stop. Take pc. LS spine x ray Stretch 

## 2011-06-19 NOTE — Progress Notes (Signed)
Patient ID: Diana Barker, female   DOB: [birth year], 66 y.o.   MRN: [id number]  Subjective:    Patient ID: Diana Barker, female    DOB: 18-May-1945, 66 y.o.   MRN: [id number]  Leg Pain  Pertinent negatives include no numbness.   C/o severe dull 6/10 LBP going down LLE x 2 wks - worse now. No falls. No rash F/u pancreatitis and abd pain (gone) F/u HTN and alcoholism  Review of Systems  Constitutional: Negative for chills, activity change, appetite change, fatigue and unexpected weight change.  HENT: Negative for congestion, mouth sores and sinus pressure.   Eyes: Negative for visual disturbance.  Respiratory: Negative for cough and chest tightness.   Gastrointestinal: Negative for nausea and abdominal pain.  Genitourinary: Negative for frequency, difficulty urinating and vaginal pain.  Musculoskeletal: Negative for back pain and gait problem.  Skin: Negative for pallor and rash.  Neurological: Negative for dizziness, tremors, weakness, numbness and headaches.  Psychiatric/Behavioral: Negative for confusion and sleep disturbance.       Objective:   Physical Exam  Constitutional: She appears well-developed and well-nourished. No distress.  HENT:  Head: Normocephalic.  Right Ear: External ear normal.  Left Ear: External ear normal.  Nose: Nose normal.  Mouth/Throat: Oropharynx is clear and moist.  Eyes: Conjunctivae are normal. Pupils are equal, round, and reactive to light. Right eye exhibits no discharge. Left eye exhibits no discharge.  Neck: Normal range of motion. Neck supple. No JVD present. No tracheal deviation present. No thyromegaly present.  Cardiovascular: Normal rate, regular rhythm and normal heart sounds.   Pulmonary/Chest: No stridor. No respiratory distress. She has no wheezes.  Abdominal: Soft. Bowel sounds are normal. She exhibits no distension and no mass. There is no tenderness. There is no rebound and no guarding.  Musculoskeletal: She exhibits no edema  and no tenderness.  Lymphadenopathy:    She has no cervical adenopathy.  Neurological: She displays normal reflexes. No cranial nerve deficit. She exhibits normal muscle tone. Coordination normal.  Skin: No rash noted. No erythema.  Psychiatric: She has a normal mood and affect. Her behavior is normal. Judgment and thought content normal.          Assessment & Plan:

## 2011-06-20 MED ORDER — METHYLPREDNISOLONE ACETATE PF 80 MG/ML IJ SUSP
120.0000 mg | Freq: Once | INTRAMUSCULAR | Status: AC
  Administered 2011-06-20: 120 mg via INTRAMUSCULAR

## 2011-06-20 NOTE — Progress Notes (Signed)
Addended by: Merrilyn Puma on: 06/20/2011 11:06 AM   Modules accepted: Orders

## 2011-07-10 ENCOUNTER — Ambulatory Visit: Payer: Medicare Other | Admitting: Internal Medicine

## 2011-10-21 ENCOUNTER — Other Ambulatory Visit: Payer: Self-pay | Admitting: Internal Medicine

## 2011-12-11 ENCOUNTER — Ambulatory Visit: Payer: Medicare Other | Admitting: Internal Medicine

## 2012-04-27 ENCOUNTER — Other Ambulatory Visit: Payer: Self-pay | Admitting: Internal Medicine

## 2012-04-29 ENCOUNTER — Other Ambulatory Visit: Payer: Self-pay | Admitting: Internal Medicine

## 2012-05-07 ENCOUNTER — Other Ambulatory Visit: Payer: Self-pay | Admitting: Internal Medicine

## 2012-11-10 ENCOUNTER — Other Ambulatory Visit: Payer: Self-pay | Admitting: Internal Medicine

## 2012-11-12 ENCOUNTER — Encounter: Payer: Self-pay | Admitting: Gastroenterology

## 2012-11-21 ENCOUNTER — Ambulatory Visit (INDEPENDENT_AMBULATORY_CARE_PROVIDER_SITE_OTHER): Payer: Medicare Other | Admitting: Internal Medicine

## 2012-11-21 ENCOUNTER — Ambulatory Visit: Payer: Medicare Other

## 2012-11-21 ENCOUNTER — Encounter: Payer: Self-pay | Admitting: Internal Medicine

## 2012-11-21 VITALS — BP 120/90 | HR 72 | Temp 98.1°F | Resp 16 | Ht 64.0 in | Wt 109.0 lb

## 2012-11-21 DIAGNOSIS — E785 Hyperlipidemia, unspecified: Secondary | ICD-10-CM

## 2012-11-21 DIAGNOSIS — F1020 Alcohol dependence, uncomplicated: Secondary | ICD-10-CM

## 2012-11-21 DIAGNOSIS — R202 Paresthesia of skin: Secondary | ICD-10-CM

## 2012-11-21 DIAGNOSIS — I10 Essential (primary) hypertension: Secondary | ICD-10-CM

## 2012-11-21 DIAGNOSIS — R209 Unspecified disturbances of skin sensation: Secondary | ICD-10-CM

## 2012-11-21 DIAGNOSIS — K852 Alcohol induced acute pancreatitis without necrosis or infection: Secondary | ICD-10-CM

## 2012-11-21 DIAGNOSIS — K859 Acute pancreatitis without necrosis or infection, unspecified: Secondary | ICD-10-CM

## 2012-11-21 LAB — CBC WITH DIFFERENTIAL/PLATELET
Basophils Absolute: 0 10*3/uL (ref 0.0–0.1)
Basophils Absolute: 0 10*3/uL (ref 0.0–0.1)
Basophils Relative: 0 % (ref 0–1)
Eosinophils Absolute: 0 10*3/uL (ref 0.0–0.7)
Hemoglobin: 15.8 g/dL — ABNORMAL HIGH (ref 12.0–15.0)
Lymphocytes Relative: 63.7 % — ABNORMAL HIGH (ref 12.0–46.0)
Lymphocytes Relative: 56 % — ABNORMAL HIGH (ref 12–46)
Lymphs Abs: 3.1 10*3/uL (ref 0.7–4.0)
MCHC: 33.4 g/dL (ref 30.0–36.0)
MCHC: 34.1 g/dL (ref 30.0–36.0)
Monocytes Relative: 6.9 % (ref 3.0–12.0)
Neutro Abs: 1.4 10*3/uL (ref 1.4–7.7)
Neutro Abs: 1.6 10*3/uL — ABNORMAL LOW (ref 1.7–7.7)
Neutrophils Relative %: 35 % — ABNORMAL LOW (ref 43–77)
Platelets: 234 10*3/uL (ref 150.0–400.0)
RDW: 14.5 % (ref 11.5–14.6)
RDW: 14.5 % (ref 11.5–15.5)
WBC: 4.6 10*3/uL (ref 4.0–10.5)

## 2012-11-21 LAB — URINALYSIS
Hgb urine dipstick: NEGATIVE
Leukocytes, UA: NEGATIVE
Nitrite: NEGATIVE
Specific Gravity, Urine: 1.02 (ref 1.000–1.030)
Urobilinogen, UA: 1 (ref 0.0–1.0)

## 2012-11-21 LAB — LIPID PANEL
Cholesterol: 197 mg/dL (ref 0–200)
HDL: 105.3 mg/dL (ref >39.00)
LDL Cholesterol: 77 mg/dL (ref 0–99)
Total CHOL/HDL Ratio: 2
Triglycerides: 73 mg/dL (ref 0.0–149.0)

## 2012-11-21 LAB — BASIC METABOLIC PANEL
Calcium: 9.8 mg/dL (ref 8.4–10.5)
Chloride: 96 mEq/L (ref 96–112)
Creatinine, Ser: 0.7 mg/dL (ref 0.4–1.2)
Sodium: 138 mEq/L (ref 135–145)

## 2012-11-21 LAB — HEPATIC FUNCTION PANEL
ALT: 37 U/L — ABNORMAL HIGH (ref 0–35)
Bilirubin, Direct: 0.3 mg/dL (ref 0.0–0.3)
Total Bilirubin: 1 mg/dL (ref 0.3–1.2)

## 2012-11-21 LAB — VITAMIN B12: Vitamin B-12: 243 pg/mL (ref 211–911)

## 2012-11-21 LAB — TSH: TSH: 0.8 u[IU]/mL (ref 0.35–5.50)

## 2012-11-21 MED ORDER — ATENOLOL-CHLORTHALIDONE 50-25 MG PO TABS: ORAL_TABLET | ORAL | Status: DC

## 2012-11-21 MED ORDER — VITAMIN B-1 100 MG PO TABS: 100.0000 mg | ORAL_TABLET | Freq: Every day | ORAL | Status: DC

## 2012-11-21 NOTE — Assessment & Plan Note (Signed)
Discussed.

## 2012-11-21 NOTE — Assessment & Plan Note (Signed)
Continue with current prescription therapy as reflected on the Med list.  

## 2012-11-21 NOTE — Assessment & Plan Note (Addendum)
Risks of ongoing drinking discussed

## 2012-11-21 NOTE — Progress Notes (Signed)
  Subjective:   HPI  F/u pancreatitis and abd pain (gone) F/u HTN and alcoholism. She is asking me her blood type - wants to know  Wt Readings from Last 3 Encounters:  11/21/12 109 lb (49.442 kg)  06/19/11 116 lb (52.617 kg)  01/09/11 112 lb (50.803 kg)   BP Readings from Last 3 Encounters:  11/21/12 120/90  06/19/11 120/80  01/09/11 122/90      Lab Results  Component Value Date   WBC 5.6 12/14/2010   HGB 14.3 12/14/2010   HCT 41.9 12/14/2010   PLT 251 12/14/2010   GLUCOSE 99 12/14/2010   CHOL 218* 12/18/2008   TRIG 82.0 12/18/2008   HDL 97.20 12/18/2008   LDLDIRECT 101.6 12/18/2008   ALT 41* 12/14/2010   AST 47* 12/14/2010   NA 138 12/14/2010   K 2.9* 12/14/2010   CL 97 12/14/2010   CREATININE 0.67 12/14/2010   BUN 20 12/14/2010   CO2 31 12/14/2010   TSH 0.45 10/13/2010   INR 0.9 ratio 12/18/2008  \  Review of Systems  Constitutional: Negative for chills, activity change, appetite change, fatigue and unexpected weight change.  HENT: Negative for congestion, mouth sores and sinus pressure.   Eyes: Negative for visual disturbance.  Respiratory: Negative for cough and chest tightness.   Gastrointestinal: Negative for nausea and abdominal pain.  Genitourinary: Negative for frequency, difficulty urinating and vaginal pain.  Musculoskeletal: Negative for back pain and gait problem.  Skin: Negative for pallor and rash.  Neurological: Negative for dizziness, tremors, weakness, numbness and headaches.  Psychiatric/Behavioral: Negative for confusion and sleep disturbance.       Objective:   Physical Exam  Constitutional: She appears well-developed and well-nourished. No distress.  HENT:  Head: Normocephalic.  Right Ear: External ear normal.  Left Ear: External ear normal.  Nose: Nose normal.  Mouth/Throat: Oropharynx is clear and moist.  Eyes: Conjunctivae are normal. Pupils are equal, round, and reactive to light. Right eye exhibits no discharge. Left eye exhibits no discharge.   Neck: Normal range of motion. Neck supple. No JVD present. No tracheal deviation present. No thyromegaly present.  Cardiovascular: Normal rate, regular rhythm and normal heart sounds.   Pulmonary/Chest: No stridor. No respiratory distress. She has no wheezes.  Abdominal: Soft. Bowel sounds are normal. She exhibits no distension and no mass. There is no tenderness. There is no rebound and no guarding.  Musculoskeletal: She exhibits no edema and no tenderness.  Lymphadenopathy:    She has no cervical adenopathy.  Neurological: She displays normal reflexes. No cranial nerve deficit. She exhibits normal muscle tone. Coordination normal.  Skin: No rash noted. No erythema.  Psychiatric: She has a normal mood and affect. Her behavior is normal. Judgment and thought content normal.   Lab Results  Component Value Date   WBC 5.6 12/14/2010   HGB 14.3 12/14/2010   HCT 41.9 12/14/2010   PLT 251 12/14/2010   GLUCOSE 99 12/14/2010   CHOL 218* 12/18/2008   TRIG 82.0 12/18/2008   HDL 97.20 12/18/2008   LDLDIRECT 101.6 12/18/2008   ALT 41* 12/14/2010   AST 47* 12/14/2010   NA 138 12/14/2010   K 2.9* 12/14/2010   CL 97 12/14/2010   CREATININE 0.67 12/14/2010   BUN 20 12/14/2010   CO2 31 12/14/2010   TSH 0.45 10/13/2010   INR 0.9 ratio 12/18/2008          Assessment & Plan:

## 2012-11-22 LAB — ABO/RH: Rh Factor: POSITIVE

## 2012-11-22 LAB — PLEASE NOTE

## 2012-11-24 ENCOUNTER — Other Ambulatory Visit: Payer: Self-pay | Admitting: Internal Medicine

## 2012-11-24 MED ORDER — VITAMIN B-12 1000 MCG SL SUBL
1.0000 | SUBLINGUAL_TABLET | Freq: Every day | SUBLINGUAL | Status: AC
Start: 2012-11-24 — End: ?

## 2012-11-26 NOTE — Progress Notes (Signed)
Spoke with pt, advised of result note. 

## 2012-12-04 ENCOUNTER — Other Ambulatory Visit: Payer: Self-pay | Admitting: Occupational Medicine

## 2012-12-04 ENCOUNTER — Other Ambulatory Visit: Payer: Self-pay

## 2012-12-04 ENCOUNTER — Ambulatory Visit: Payer: Self-pay

## 2012-12-04 DIAGNOSIS — R52 Pain, unspecified: Secondary | ICD-10-CM

## 2012-12-06 ENCOUNTER — Encounter: Payer: Self-pay | Admitting: Internal Medicine

## 2012-12-06 ENCOUNTER — Ambulatory Visit (INDEPENDENT_AMBULATORY_CARE_PROVIDER_SITE_OTHER): Payer: Medicare Other | Admitting: Internal Medicine

## 2012-12-06 VITALS — BP 120/80 | HR 80 | Temp 97.5°F | Resp 16 | Wt 108.0 lb

## 2012-12-06 DIAGNOSIS — S20219A Contusion of unspecified front wall of thorax, initial encounter: Secondary | ICD-10-CM | POA: Insufficient documentation

## 2012-12-06 DIAGNOSIS — M25529 Pain in unspecified elbow: Secondary | ICD-10-CM | POA: Insufficient documentation

## 2012-12-06 DIAGNOSIS — M25521 Pain in right elbow: Secondary | ICD-10-CM

## 2012-12-06 DIAGNOSIS — I10 Essential (primary) hypertension: Secondary | ICD-10-CM

## 2012-12-06 DIAGNOSIS — Z5189 Encounter for other specified aftercare: Secondary | ICD-10-CM

## 2012-12-06 DIAGNOSIS — S20211D Contusion of right front wall of thorax, subsequent encounter: Secondary | ICD-10-CM

## 2012-12-06 NOTE — Assessment & Plan Note (Signed)
Cont w/rib belt, Naproxen prn To work tomorrow if ok

## 2012-12-06 NOTE — Assessment & Plan Note (Signed)
BP Readings from Last 3 Encounters:  12/06/12 120/80  11/21/12 120/90  06/19/11 120/80

## 2012-12-06 NOTE — Assessment & Plan Note (Addendum)
Naproxen prn To work tomorrow if ok

## 2012-12-06 NOTE — Progress Notes (Signed)
Subjective:   HPI  C/o a fall at work on Wed am - fell on a carpet floor. She  Went to see an occupational health doctor, had x rays (Dr Alto [place]) - dx: trunk contusion. Sh was given naproxen. Feeling better..  F/u pancreatitis and abd pain (gone) F/u HTN and alcoholism. She is asking me her blood type - wants to know  Wt Readings from Last 3 Encounters:  12/06/12 108 lb (48.988 kg)  11/21/12 109 lb (49.442 kg)  06/19/11 116 lb (52.617 kg)   BP Readings from Last 3 Encounters:  12/06/12 120/80  11/21/12 120/90  06/19/11 120/80      Lab Results  Component Value Date   WBC 4.6 11/21/2012   HGB 15.7* 11/21/2012   HCT 46.0 11/21/2012   PLT 280 11/21/2012   GLUCOSE 92 11/21/2012   CHOL 197 11/21/2012   TRIG 73.0 11/21/2012   HDL 105.30 11/21/2012   LDLDIRECT 101.6 12/18/2008   LDLCALC 77 11/21/2012   ALT 37* 11/21/2012   AST 60* 11/21/2012   NA 138 11/21/2012   K 3.9 11/21/2012   CL 96 11/21/2012   CREATININE 0.7 11/21/2012   BUN 9 11/21/2012   CO2 31 11/21/2012   TSH 0.80 11/21/2012   INR 0.9 ratio 12/18/2008  \  Review of Systems  Constitutional: Negative for chills, activity change, appetite change, fatigue and unexpected weight change.  HENT: Negative for congestion, mouth sores and sinus pressure.   Eyes: Negative for visual disturbance.  Respiratory: Negative for cough and chest tightness.   Gastrointestinal: Negative for nausea and abdominal pain.  Genitourinary: Negative for frequency, difficulty urinating and vaginal pain.  Musculoskeletal: Negative for back pain and gait problem.  Skin: Negative for pallor and rash.  Neurological: Negative for dizziness, tremors, weakness, numbness and headaches.  Psychiatric/Behavioral: Negative for confusion and sleep disturbance.       Objective:   Physical Exam  Constitutional: She appears well-developed and well-nourished. No distress.  HENT:  Head: Normocephalic.  Right Ear: External ear normal.  Left Ear: External ear normal.   Nose: Nose normal.  Mouth/Throat: Oropharynx is clear and moist.  Eyes: Conjunctivae are normal. Pupils are equal, round, and reactive to light. Right eye exhibits no discharge. Left eye exhibits no discharge.  Neck: Normal range of motion. Neck supple. No JVD present. No tracheal deviation present. No thyromegaly present.  Cardiovascular: Normal rate, regular rhythm and normal heart sounds.   Pulmonary/Chest: No stridor. No respiratory distress. She has no wheezes.  Abdominal: Soft. Bowel sounds are normal. She exhibits no distension and no mass. There is no tenderness. There is no rebound and no guarding.  Musculoskeletal: She exhibits no edema and no tenderness.  Lymphadenopathy:    She has no cervical adenopathy.  Neurological: She displays normal reflexes. No cranial nerve deficit. She exhibits normal muscle tone. Coordination normal.  Skin: No rash noted. No erythema.  Psychiatric: She has a normal mood and affect. Her behavior is normal. Judgment and thought content normal.  R chest wall and R elbow are tender   Lab Results  Component Value Date   WBC 4.6 11/21/2012   HGB 15.7* 11/21/2012   HCT 46.0 11/21/2012   PLT 280 11/21/2012   GLUCOSE 92 11/21/2012   CHOL 197 11/21/2012   TRIG 73.0 11/21/2012   HDL 105.30 11/21/2012   LDLDIRECT 101.6 12/18/2008   LDLCALC 77 11/21/2012   ALT 37* 11/21/2012   AST 60* 11/21/2012   NA 138 11/21/2012  K 3.9 11/21/2012   CL 96 11/21/2012   CREATININE 0.7 11/21/2012   BUN 9 11/21/2012   CO2 31 11/21/2012   TSH 0.80 11/21/2012   INR 0.9 ratio 12/18/2008          Assessment & Plan:

## 2012-12-10 ENCOUNTER — Other Ambulatory Visit: Payer: Self-pay | Admitting: Family Medicine

## 2012-12-10 DIAGNOSIS — W19XXXA Unspecified fall, initial encounter: Secondary | ICD-10-CM

## 2012-12-10 DIAGNOSIS — T148XXA Other injury of unspecified body region, initial encounter: Secondary | ICD-10-CM

## 2012-12-11 ENCOUNTER — Ambulatory Visit
Admission: RE | Admit: 2012-12-11 | Discharge: 2012-12-11 | Disposition: A | Discharge status: Home / Self Care | Payer: Self-pay | Source: Ambulatory Visit | Attending: Family Medicine | Admitting: Family Medicine

## 2012-12-11 DIAGNOSIS — T148XXA Other injury of unspecified body region, initial encounter: Secondary | ICD-10-CM

## 2012-12-11 DIAGNOSIS — W19XXXA Unspecified fall, initial encounter: Secondary | ICD-10-CM

## 2012-12-23 ENCOUNTER — Telehealth: Payer: Self-pay | Admitting: *Deleted

## 2012-12-23 ENCOUNTER — Ambulatory Visit: Payer: Self-pay | Admitting: Internal Medicine

## 2012-12-23 NOTE — Telephone Encounter (Signed)
ToRoma Schanz Fax: [phone] From: [facility] Date/ Time: 12/21/2012 3:05 PM Taken By: Mercer Pod, CSR Caller: Elease Hashimoto Facility: not collected Patient: Diana, Barker DOB: 1946/02/10 Phone: [phone] Reason for Call: See info below Regarding Appointment: Yes Appt Date: 12/23/2012 Appt Time: 8:00:00 AM Provider: Sonda Primes (Adults only) Reason: Details: needs to postpone until another day. Outcome: Cancelled appointment in EPIC [facility])

## 2013-02-22 ENCOUNTER — Other Ambulatory Visit: Payer: Self-pay | Admitting: Internal Medicine

## 2013-03-06 ENCOUNTER — Other Ambulatory Visit: Payer: Self-pay

## 2013-03-11 ENCOUNTER — Ambulatory Visit: Payer: Self-pay | Admitting: Internal Medicine

## 2013-03-12 ENCOUNTER — Encounter: Payer: Self-pay | Admitting: Internal Medicine

## 2013-03-12 ENCOUNTER — Ambulatory Visit (INDEPENDENT_AMBULATORY_CARE_PROVIDER_SITE_OTHER): Payer: Medicare Other | Admitting: Internal Medicine

## 2013-03-12 VITALS — BP 140/82 | HR 80 | Temp 97.5°F | Resp 16 | Wt 108.0 lb

## 2013-03-12 DIAGNOSIS — S20211A Contusion of right front wall of thorax, initial encounter: Secondary | ICD-10-CM

## 2013-03-12 DIAGNOSIS — B029 Zoster without complications: Secondary | ICD-10-CM | POA: Insufficient documentation

## 2013-03-12 DIAGNOSIS — S20219A Contusion of unspecified front wall of thorax, initial encounter: Secondary | ICD-10-CM | POA: Insufficient documentation

## 2013-03-12 DIAGNOSIS — I10 Essential (primary) hypertension: Secondary | ICD-10-CM

## 2013-03-12 MED ORDER — VALACYCLOVIR HCL 1 G PO TABS: 1000.0000 mg | ORAL_TABLET | Freq: Three times a day (TID) | ORAL | Status: DC

## 2013-03-12 MED ORDER — TRAMADOL HCL 50 MG PO TABS: 50.0000 mg | ORAL_TABLET | Freq: Two times a day (BID) | ORAL | Status: DC | PRN

## 2013-03-12 MED ORDER — TRIAMCINOLONE ACETONIDE 0.5 % EX CREA: 1.0000 "application " | TOPICAL_CREAM | Freq: Three times a day (TID) | CUTANEOUS | Status: DC

## 2013-03-12 NOTE — Assessment & Plan Note (Signed)
Continue with current prescription therapy as reflected on the Med list.  

## 2013-03-12 NOTE — Assessment & Plan Note (Signed)
11/14 R face Tramadol Triamc oint Valtrex x 10 d

## 2013-03-12 NOTE — Assessment & Plan Note (Addendum)
Heat S/p Ortho eval Gen surgery consult was offered

## 2013-03-12 NOTE — Progress Notes (Signed)
Subjective:   HPI C/o R chin and R cheek painful rash x 3 d  C/o lump on R upper back H/o a fall at work on Wed am - fell on a carpet floor. She  Went to see an occupational health doctor, had x rays (Dr Alto [place]) - dx: trunk contusion. Sh was given naproxen. Feeling better..Now she has a lump over R shoulder blade. S/p Ortho eval  F/u pancreatitis and abd pain (gone) F/u HTN and alcoholism. She is asking me her blood type - wants to know  Wt Readings from Last 3 Encounters:  03/12/13 108 lb (48.988 kg)  12/06/12 108 lb (48.988 kg)  11/21/12 109 lb (49.442 kg)   BP Readings from Last 3 Encounters:  03/12/13 140/82  12/06/12 120/80  11/21/12 120/90      Lab Results  Component Value Date   WBC 4.6 11/21/2012   HGB 15.7* 11/21/2012   HCT 46.0 11/21/2012   PLT 280 11/21/2012   GLUCOSE 92 11/21/2012   CHOL 197 11/21/2012   TRIG 73.0 11/21/2012   HDL 105.30 11/21/2012   LDLDIRECT 101.6 12/18/2008   LDLCALC 77 11/21/2012   ALT 37* 11/21/2012   AST 60* 11/21/2012   NA 138 11/21/2012   K 3.9 11/21/2012   CL 96 11/21/2012   CREATININE 0.7 11/21/2012   BUN 9 11/21/2012   CO2 31 11/21/2012   TSH 0.80 11/21/2012   INR 0.9 ratio 12/18/2008  \  Review of Systems  Constitutional: Negative for chills, activity change, appetite change, fatigue and unexpected weight change.  HENT: Negative for congestion, mouth sores and sinus pressure.   Eyes: Negative for visual disturbance.  Respiratory: Negative for cough and chest tightness.   Gastrointestinal: Negative for nausea and abdominal pain.  Genitourinary: Negative for frequency, difficulty urinating and vaginal pain.  Musculoskeletal: Negative for back pain and gait problem.  Skin: Negative for pallor and rash.  Neurological: Negative for dizziness, tremors, weakness, numbness and headaches.  Psychiatric/Behavioral: Negative for confusion and sleep disturbance.       Objective:   Physical Exam  Constitutional: She appears well-developed and  well-nourished. No distress.  HENT:  Head: Normocephalic.  Right Ear: External ear normal.  Left Ear: External ear normal.  Nose: Nose normal.  Mouth/Throat: Oropharynx is clear and moist.  Eyes: Conjunctivae are normal. Pupils are equal, round, and reactive to light. Right eye exhibits no discharge. Left eye exhibits no discharge.  Neck: Normal range of motion. Neck supple. No JVD present. No tracheal deviation present. No thyromegaly present.  Cardiovascular: Normal rate, regular rhythm and normal heart sounds.   Pulmonary/Chest: No stridor. No respiratory distress. She has no wheezes.  Abdominal: Soft. Bowel sounds are normal. She exhibits no distension and no mass. There is no tenderness. There is no rebound and no guarding.  Musculoskeletal: She exhibits no edema and no tenderness.  Lymphadenopathy:    She has no cervical adenopathy.  Neurological: She displays normal reflexes. No cranial nerve deficit. She exhibits normal muscle tone. Coordination normal.  Skin: No rash noted. No erythema.  Psychiatric: She has a normal mood and affect. Her behavior is normal. Judgment and thought content normal.  R post chest wall soft mass 8x7 cm sensitive over R shoulder blade Vesic clusters x 2 on R chin>>R cheek   Lab Results  Component Value Date   WBC 4.6 11/21/2012   HGB 15.7* 11/21/2012   HCT 46.0 11/21/2012   PLT 280 11/21/2012   GLUCOSE 92 11/21/2012  CHOL 197 11/21/2012   TRIG 73.0 11/21/2012   HDL 105.30 11/21/2012   LDLDIRECT 101.6 12/18/2008   LDLCALC 77 11/21/2012   ALT 37* 11/21/2012   AST 60* 11/21/2012   NA 138 11/21/2012   K 3.9 11/21/2012   CL 96 11/21/2012   CREATININE 0.7 11/21/2012   BUN 9 11/21/2012   CO2 31 11/21/2012   TSH 0.80 11/21/2012   INR 0.9 ratio 12/18/2008          Assessment & Plan:

## 2013-03-12 NOTE — Progress Notes (Signed)
Pre visit review using our clinic review tool, if applicable. No additional management support is needed unless otherwise documented below in the visit note. 

## 2013-05-01 HISTORY — PX: DENTAL SURGERY: SHX609

## 2013-08-19 ENCOUNTER — Encounter: Payer: Self-pay | Admitting: Gastroenterology

## 2013-08-22 ENCOUNTER — Encounter: Payer: Self-pay | Admitting: Internal Medicine

## 2013-08-27 ENCOUNTER — Encounter: Payer: Self-pay | Admitting: Internal Medicine

## 2013-08-27 ENCOUNTER — Ambulatory Visit (INDEPENDENT_AMBULATORY_CARE_PROVIDER_SITE_OTHER): Payer: Medicare Other | Admitting: Internal Medicine

## 2013-08-27 VITALS — BP 122/80 | HR 80 | Temp 98.1°F | Resp 16 | Wt 108.0 lb

## 2013-08-27 DIAGNOSIS — S20219A Contusion of unspecified front wall of thorax, initial encounter: Secondary | ICD-10-CM

## 2013-08-27 NOTE — Assessment & Plan Note (Signed)
R post chest wall soft mass 7x5 cm over R shoulder blade, tender  ?scarred hematoma F/u w/Ortho

## 2013-08-27 NOTE — Progress Notes (Signed)
Pre visit review using our clinic review tool, if applicable. No additional management support is needed unless otherwise documented below in the visit note. 

## 2013-08-27 NOTE — Progress Notes (Signed)
Subjective:   HPI   C/o lump on R upper back - not better H/o a fall at work on 12/04/12 - fell on a carpet floor. She went to see an occupational health doctor, had x rays (Dr Geoffry Paradise) - dx: trunk contusion. Now she still has a lump over R shoulder blade and it is tender to sleep on.   F/u pancreatitis and abd pain (gone) F/u HTN and alcoholism. She is asking me her blood type - wants to know  Wt Readings from Last 3 Encounters:  08/27/13 108 lb (48.988 kg)  03/12/13 108 lb (48.988 kg)  12/06/12 108 lb (48.988 kg)   BP Readings from Last 3 Encounters:  08/27/13 122/80  03/12/13 140/82  12/06/12 120/80      Lab Results  Component Value Date   WBC 4.6 11/21/2012   HGB 15.7* 11/21/2012   HCT 46.0 11/21/2012   PLT 280 11/21/2012   GLUCOSE 92 11/21/2012   CHOL 197 11/21/2012   TRIG 73.0 11/21/2012   HDL 105.30 11/21/2012   LDLDIRECT 101.6 12/18/2008   LDLCALC 77 11/21/2012   ALT 37* 11/21/2012   AST 60* 11/21/2012   NA 138 11/21/2012   K 3.9 11/21/2012   CL 96 11/21/2012   CREATININE 0.7 11/21/2012   BUN 9 11/21/2012   CO2 31 11/21/2012   TSH 0.80 11/21/2012   INR 0.9 ratio 12/18/2008  \  Review of Systems  Constitutional: Negative for chills, activity change, appetite change, fatigue and unexpected weight change.  HENT: Negative for congestion, mouth sores and sinus pressure.   Eyes: Negative for visual disturbance.  Respiratory: Negative for cough and chest tightness.   Gastrointestinal: Negative for nausea and abdominal pain.  Genitourinary: Negative for frequency, difficulty urinating and vaginal pain.  Musculoskeletal: Negative for back pain and gait problem.  Skin: Negative for pallor and rash.  Neurological: Negative for dizziness, tremors, weakness, numbness and headaches.  Psychiatric/Behavioral: Negative for confusion and sleep disturbance.       Objective:   Physical Exam  Constitutional: She appears well-developed and well-nourished. No distress.  HENT:  Head:  Normocephalic.  Right Ear: External ear normal.  Left Ear: External ear normal.  Nose: Nose normal.  Mouth/Throat: Oropharynx is clear and moist.  Eyes: Conjunctivae are normal. Pupils are equal, round, and reactive to light. Right eye exhibits no discharge. Left eye exhibits no discharge.  Neck: Normal range of motion. Neck supple. No JVD present. No tracheal deviation present. No thyromegaly present.  Cardiovascular: Normal rate, regular rhythm and normal heart sounds.   Pulmonary/Chest: No stridor. No respiratory distress. She has no wheezes.  Abdominal: Soft. Bowel sounds are normal. She exhibits no distension and no mass. There is no tenderness. There is no rebound and no guarding.  Musculoskeletal: She exhibits no edema and no tenderness.  Lymphadenopathy:    She has no cervical adenopathy.  Neurological: She displays normal reflexes. No cranial nerve deficit. She exhibits normal muscle tone. Coordination normal.  Skin: No rash noted. No erythema.  Psychiatric: She has a normal mood and affect. Her behavior is normal. Judgment and thought content normal.  R post chest wall soft mass 7x5 cm  over R shoulder blade, tender    Lab Results  Component Value Date   WBC 4.6 11/21/2012   HGB 15.7* 11/21/2012   HCT 46.0 11/21/2012   PLT 280 11/21/2012   GLUCOSE 92 11/21/2012   CHOL 197 11/21/2012   TRIG 73.0 11/21/2012   HDL 105.30 11/21/2012  LDLDIRECT 101.6 12/18/2008   LDLCALC 77 11/21/2012   ALT 37* 11/21/2012   AST 60* 11/21/2012   NA 138 11/21/2012   K 3.9 11/21/2012   CL 96 11/21/2012   CREATININE 0.7 11/21/2012   BUN 9 11/21/2012   CO2 31 11/21/2012   TSH 0.80 11/21/2012   INR 0.9 ratio 12/18/2008          Assessment & Plan:

## 2013-10-01 ENCOUNTER — Ambulatory Visit (AMBULATORY_SURGERY_CENTER): Payer: Self-pay | Admitting: *Deleted

## 2013-10-01 ENCOUNTER — Telehealth: Payer: Self-pay | Admitting: Internal Medicine

## 2013-10-01 VITALS — Ht 64.0 in | Wt 108.2 lb

## 2013-10-01 DIAGNOSIS — Z85038 Personal history of other malignant neoplasm of large intestine: Secondary | ICD-10-CM

## 2013-10-01 MED ORDER — MOVIPREP 100 G PO SOLR: ORAL | Status: DC

## 2013-10-01 NOTE — Telephone Encounter (Signed)
Talked to pharmacist; pt will use voucher for free MoviPrep.  Pt notified.

## 2013-10-01 NOTE — Progress Notes (Signed)
No allergies to eggs or soy. No problems with anesthesia.  Pt given Emmi instructions for colonoscopy  No oxygen use  No diet drug use  

## 2013-10-15 ENCOUNTER — Encounter: Payer: Self-pay | Admitting: Internal Medicine

## 2013-10-15 ENCOUNTER — Ambulatory Visit (AMBULATORY_SURGERY_CENTER): Payer: Medicare Other | Admitting: Internal Medicine

## 2013-10-15 VITALS — BP 131/84 | HR 54 | Temp 98.0°F | Resp 36 | Ht 64.0 in | Wt 108.0 lb

## 2013-10-15 DIAGNOSIS — D126 Benign neoplasm of colon, unspecified: Secondary | ICD-10-CM

## 2013-10-15 DIAGNOSIS — Z85038 Personal history of other malignant neoplasm of large intestine: Secondary | ICD-10-CM

## 2013-10-15 DIAGNOSIS — K635 Polyp of colon: Secondary | ICD-10-CM

## 2013-10-15 HISTORY — DX: Polyp of colon: K63.5

## 2013-10-15 MED ORDER — SODIUM CHLORIDE 0.9 % IV SOLN: 500.0000 mL | INTRAVENOUS | Status: DC

## 2013-10-15 NOTE — Progress Notes (Signed)
Called to room to assist during endoscopic procedure.  Patient ID and intended procedure confirmed with present staff. Received instructions for my participation in the procedure from the performing physician.  

## 2013-10-15 NOTE — Progress Notes (Signed)
Procedure ends, to recovery, report given and VSS. 

## 2013-10-15 NOTE — Patient Instructions (Signed)
Discharge instructions given with verbal understanding. Handout on polyps. Resume previous medications. YOU HAD AN ENDOSCOPIC PROCEDURE TODAY AT THE [facility]: Refer to the procedure report that was given to you for any specific questions about what was found during the examination.  If the procedure report does not answer your questions, please call your gastroenterologist to clarify.  If you requested that your care partner not be given the details of your procedure findings, then the procedure report has been included in a sealed envelope for you to review at your convenience later.  YOU SHOULD EXPECT: Some feelings of bloating in the abdomen. Passage of more gas than usual.  Walking can help get rid of the air that was put into your GI tract during the procedure and reduce the bloating. If you had a lower endoscopy (such as a colonoscopy or flexible sigmoidoscopy) you may notice spotting of blood in your stool or on the toilet paper. If you underwent a bowel prep for your procedure, then you may not have a normal bowel movement for a few days.  DIET: Your first meal following the procedure should be a light meal and then it is ok to progress to your normal diet.  A half-sandwich or bowl of soup is an example of a good first meal.  Heavy or fried foods are harder to digest and may make you feel nauseous or bloated.  Likewise meals heavy in dairy and vegetables can cause extra gas to form and this can also increase the bloating.  Drink plenty of fluids but you should avoid alcoholic beverages for 24 hours.  ACTIVITY: Your care partner should take you home directly after the procedure.  You should plan to take it easy, moving slowly for the rest of the day.  You can resume normal activity the day after the procedure however you should NOT DRIVE or use heavy machinery for 24 hours (because of the sedation medicines used during the test).    SYMPTOMS TO REPORT IMMEDIATELY: A  gastroenterologist can be reached at any hour.  During normal business hours, 8:30 AM to 5:00 PM Monday through Friday, call [phone].  After hours and on weekends, please call the GI answering service at [phone] who will take a message and have the physician on call contact you.   Following lower endoscopy (colonoscopy or flexible sigmoidoscopy):  Excessive amounts of blood in the stool  Significant tenderness or worsening of abdominal pains  Swelling of the abdomen that is new, acute  Fever of 100F or higher  FOLLOW UP: If any biopsies were taken you will be contacted by phone or by letter within the next 1-3 weeks.  Call your gastroenterologist if you have not heard about the biopsies in 3 weeks.  Our staff will call the home number listed on your records the next business day following your procedure to check on you and address any questions or concerns that you may have at that time regarding the information given to you following your procedure. This is a courtesy call and so if there is no answer at the home number and we have not heard from you through the emergency physician on call, we will assume that you have returned to your regular daily activities without incident.  SIGNATURES/CONFIDENTIALITY: You and/or your care partner have signed paperwork which will be entered into your electronic medical record.  These signatures attest to the fact that that the information above on your After Visit Summary has been   reviewed and is understood.  Full responsibility of the confidentiality of this discharge information lies with you and/or your care-partner. 

## 2013-10-15 NOTE — Op Note (Signed)
[place]  [address] [place], [id number]   COLONOSCOPY PROCEDURE REPORT  PATIENT: Diana Barker, Diana Barker  MR#: [id number] BIRTHDATE: 1946/04/28 , 67  yrs. old GENDER: Female ENDOSCOPIST: Eustace Quail, MD REFERRED UX:[id number] Program Recall PROCEDURE DATE:  10/15/2013 PROCEDURE:   Colonoscopy with snare polypectomy x 1 First Screening Colonoscopy - Avg.  risk and is 50 yrs.  old or older - No.  Prior Negative Screening - Now for repeat screening. N/A  History of Adenoma - Now for follow-up colonoscopy & has been > or = to 3 yrs.  N/A  Polyps Removed Today? Yes. ASA CLASS:   Class II INDICATIONS:High risk patient with personal history of colon cancer (s/p right hemicolectomy 2010; last 11-2009 (hpp) ). MEDICATIONS: MAC sedation, administered by CRNA and propofol (Diprivan) 200mg  IV  DESCRIPTION OF PROCEDURE:   After the risks benefits and alternatives of the procedure were thoroughly explained, informed consent was obtained.  A digital rectal exam revealed no abnormalities of the rectum.   The LB [id number]  endoscope was introduced through the anus and advanced to the surgical anastomosis. No adverse events experienced.   The quality of the prep was excellent, using MoviPrep  The instrument was then slowly withdrawn as the colon was fully examined.      COLON FINDINGS: A diminutive polyp was found in the transverse colon.  A polypectomy was performed with a cold snare.  The resection was complete and the polyp tissue was completely retrieved.   There was evidence of a prior ileocolonic surgical anastomosis The finding was in the right colon.   The colon mucosa was otherwise normal.  Retroflexed views revealed no abnormalities. The time to cecum=3 minutes 53 seconds.  Withdrawal time=7 minutes 31 seconds.  The scope was withdrawn and the procedure completed.  COMPLICATIONS: There were no complications.  ENDOSCOPIC IMPRESSION: 1.   Diminutive  polyp was found in the transverse colon; polypectomy was performed with a cold snare 2.   There was evidence of a prior ileocolonic surgical anastomosis in the right colon 3.   The colon mucosa was otherwise normal  RECOMMENDATIONS: 1.Follow up colonoscopy in 5 years   eSigned:  Eustace Quail, MD 10/15/2013 10:38 AM   cc: [place].  [place], MD and The Patient

## 2013-10-16 ENCOUNTER — Telehealth: Payer: Self-pay | Admitting: *Deleted

## 2013-10-16 NOTE — Telephone Encounter (Signed)
  Follow up Call-  Call back number 10/15/2013  Post procedure Call Back phone  # [phone]  Permission to leave phone message Yes     No answer at # given.  Left message on voicemail.

## 2013-10-21 ENCOUNTER — Encounter: Payer: Self-pay | Admitting: Internal Medicine

## 2014-02-13 ENCOUNTER — Other Ambulatory Visit: Payer: Self-pay

## 2014-04-16 ENCOUNTER — Telehealth: Payer: Self-pay | Admitting: Internal Medicine

## 2014-04-16 MED ORDER — ATENOLOL-CHLORTHALIDONE 50-25 MG PO TABS: 1.0000 | ORAL_TABLET | Freq: Every day | ORAL | Status: DC

## 2014-04-16 NOTE — Telephone Encounter (Signed)
Notified [facility] 

## 2014-04-16 NOTE — Telephone Encounter (Signed)
Pt requesting refill of BP medication Atenolol, pt completely out.

## 2014-06-23 ENCOUNTER — Encounter: Payer: Self-pay | Admitting: Family

## 2014-06-23 ENCOUNTER — Ambulatory Visit (INDEPENDENT_AMBULATORY_CARE_PROVIDER_SITE_OTHER): Payer: Medicare Other | Admitting: Family

## 2014-06-23 VITALS — BP 110/88 | HR 67 | Temp 98.5°F | Resp 18 | Ht 65.0 in | Wt 118.0 lb

## 2014-06-23 DIAGNOSIS — R059 Cough, unspecified: Secondary | ICD-10-CM

## 2014-06-23 DIAGNOSIS — R05 Cough: Secondary | ICD-10-CM | POA: Diagnosis not present

## 2014-06-23 MED ORDER — AZITHROMYCIN 250 MG PO TABS: ORAL_TABLET | ORAL | Status: DC

## 2014-06-23 NOTE — Progress Notes (Signed)
   Subjective:    Patient ID: Diana Barker, female    DOB: [birth year], 69 y.o.   MRN: [id number]  Chief Complaint  Patient presents with  . Cough    Chills, feverish, productive cough, congestion, since yesterday, can she have a work note for today and tomorrow    HPI:  Diana Barker is a 69 y.o. female who presents today for an acute visit.   This is a new problem. Associated symptoms of chills, feverish, non-productive cough, and congestion has been going on since yesterday. Has tried Alka-seltzer plus which has helped a little. Denies any antibiotic use.    No Known Allergies   Current Outpatient Prescriptions on File Prior to Visit  Medication Sig Dispense Refill  . aspirin 81 MG tablet Take 81 mg by mouth daily.      Marland Kitchen atenolol-chlorthalidone (TENORETIC) 50-25 MG per tablet Take 1 tablet by mouth daily. 90 tablet 0  . Cholecalciferol (VITAMIN D3) 1000 UNITS tablet Take 2,000 Units by mouth daily.      . Cyanocobalamin (VITAMIN B-12) 1000 MCG SUBL Place 1 tablet (1,000 mcg total) under the tongue daily. 100 tablet 3  . MOVIPREP 100 G SOLR moviprep as directed. No substitutions 1 kit 0  . Pancrelipase, Lip-Prot-Amyl, 24000 UNITS CPEP Take 1 capsule (24,000 Units total) by mouth 3 (three) times daily with meals. 180 capsule 5   No current facility-administered medications on file prior to visit.    Review of Systems  Constitutional: Positive for fever and chills.  HENT: Positive for congestion. Negative for sinus pressure and sore throat.   Respiratory: Positive for cough and chest tightness. Negative for shortness of breath.   Gastrointestinal: Negative for nausea, vomiting, abdominal pain and diarrhea.  Neurological: Positive for headaches.      Objective:    BP 110/88 mmHg  Pulse 67  Temp(Src) 98.5 F (36.9 C) (Oral)  Resp 18  Ht $R'5\' 5"'RY$  (1.651 m)  Wt 118 lb (53.524 kg)  BMI 19.64 kg/m2  SpO2 99% Nursing note and vital signs reviewed.  Physical Exam    Constitutional: She is oriented to person, place, and time. She appears well-developed and well-nourished. No distress.  HENT:  Right Ear: Hearing, tympanic membrane, external ear and ear canal normal.  Left Ear: Hearing, tympanic membrane, external ear and ear canal normal.  Nose: Nose normal. Right sinus exhibits no maxillary sinus tenderness and no frontal sinus tenderness. Left sinus exhibits no maxillary sinus tenderness and no frontal sinus tenderness.  Mouth/Throat: Uvula is midline, oropharynx is clear and moist and mucous membranes are normal. Abnormal dentition.  Cardiovascular: Normal rate, regular rhythm, normal heart sounds and intact distal pulses.   Pulmonary/Chest: Effort normal and breath sounds normal.  Neurological: She is alert and oriented to person, place, and time.  Skin: Skin is warm and dry.  Psychiatric: She has a normal mood and affect. Her behavior is normal. Judgment and thought content normal.       Assessment & Plan:

## 2014-06-23 NOTE — Assessment & Plan Note (Signed)
Symptoms and exam consistent with potential upper respiratory infection, however cannot rule out bronchitis. Continue OTC medications for supportive care at this time. Patient provided written prescription for Azithromycin if symptoms worsen in the next 2-3 days. Follow up if symptoms worsen or fail to improve.

## 2014-06-23 NOTE — Progress Notes (Signed)
Pre visit review using our clinic review tool, if applicable. No additional management support is needed unless otherwise documented below in the visit note. 

## 2014-06-23 NOTE — Patient Instructions (Addendum)
Thank you for choosing [facility].  Summary/Instructions:  Please wait to start the antibiotic. If you symptoms worsen then please start the antibiotic.   Your prescription(s) have been submitted to your pharmacy or been printed and provided for you. Please take as directed and contact our office if you believe you are having problem(s) with the medication(s) or have any questions.  If your symptoms worsen or fail to improve, please contact our office for further instruction, or in case of emergency go directly to the emergency room at the closest medical facility.   General Recommendations:    Please drink plenty of fluids.  Get plenty of rest   Sleep in humidified air  Use saline nasal sprays  Netti pot   OTC Medications:  Decongestants - helps relieve congestion   Flonase (generic fluticasone) or Nasacort (generic triamcinolone) - please make sure to use the "cross-over" technique at a 45 degree angle towards the opposite eye as opposed to straight up the nasal passageway.   Sudafed (generic pseudoephedrine - Note this is the one that is available behind the pharmacy counter); Products with phenylephrine (-PE) may also be used but is often not as effective as pseudoephedrine.   If you have HIGH BLOOD PRESSURE - Coricidin HBP; AVOID any product that is -D as this contains pseudoephedrine which may increase your blood pressure.  Afrin (oxymetazoline) every 6-8 hours for up to 3 days.   Allergies - helps relieve runny nose, itchy eyes and sneezing   Claritin (generic loratidine), Allegra (fexofenidine), or Zyrtec (generic cyrterizine) for runny nose. These medications should not cause drowsiness.  Note - Benadryl (generic diphenhydramine) may be used however may cause drowsiness  Cough -   Delsym or Robitussin (generic dextromethorphan)  Expectorants - helps loosen mucus to ease removal   Mucinex (generic guaifenesin) as directed on the package.  Headaches  / General Aches   Tylenol (generic acetaminophen) - DO NOT EXCEED 3 grams (3,000 mg) in a 24 hour time period  Advil/Motrin (generic ibuprofen)   Sore Throat -   Salt water gargle   Chloraseptic (generic benzocaine) spray or lozenges / Sucrets (generic dyclonine)

## 2014-07-17 ENCOUNTER — Telehealth: Payer: Self-pay

## 2014-07-17 NOTE — Telephone Encounter (Signed)
Not able to leave message about flu vaccine, mailbox full

## 2014-07-21 ENCOUNTER — Telehealth: Payer: Self-pay | Admitting: Internal Medicine

## 2014-07-21 MED ORDER — ATENOLOL-CHLORTHALIDONE 50-25 MG PO TABS: 1.0000 | ORAL_TABLET | Freq: Every day | ORAL | Status: DC

## 2014-07-21 NOTE — Telephone Encounter (Signed)
Patient [facility] [address]. appt set for monday

## 2014-07-21 NOTE — Telephone Encounter (Signed)
Approved, pharmacy to notify

## 2014-07-27 ENCOUNTER — Encounter: Payer: Self-pay | Admitting: Internal Medicine

## 2014-07-27 ENCOUNTER — Ambulatory Visit (INDEPENDENT_AMBULATORY_CARE_PROVIDER_SITE_OTHER): Payer: Medicare Other | Admitting: Internal Medicine

## 2014-07-27 ENCOUNTER — Telehealth: Payer: Self-pay | Admitting: Internal Medicine

## 2014-07-27 VITALS — BP 120/78 | HR 85 | Wt 112.0 lb

## 2014-07-27 DIAGNOSIS — S20211S Contusion of right front wall of thorax, sequela: Secondary | ICD-10-CM

## 2014-07-27 DIAGNOSIS — I10 Essential (primary) hypertension: Secondary | ICD-10-CM

## 2014-07-27 NOTE — Telephone Encounter (Signed)
emmi emailed °

## 2014-07-27 NOTE — Progress Notes (Signed)
Pre visit review using our clinic review tool, if applicable. No additional management support is needed unless otherwise documented below in the visit note. 

## 2014-07-27 NOTE — Progress Notes (Signed)
Subjective:   HPI   F/u lump on R upper back - not better - seeing Dr Cay Schillings (Ortho) H/o a fall at work on 12/04/12 - fell on a carpet floor. She went to see an occupational health doctor, had x rays (Dr Geoffry Paradise) - dx: trunk contusion. Now she still has a lump over R shoulder blade and it is tender to sleep on.   F/u pancreatitis and abd pain (gone) F/u HTN and alcoholism. She is asking me her blood type - wants to know  Wt Readings from Last 3 Encounters:  07/27/14 112 lb (50.803 kg)  06/23/14 118 lb (53.524 kg)  10/15/13 108 lb (48.988 kg)   BP Readings from Last 3 Encounters:  07/27/14 120/78  06/23/14 110/88  10/15/13 131/84      Lab Results  Component Value Date   WBC 4.6 11/21/2012   HGB 15.7* 11/21/2012   HCT 46.0 11/21/2012   PLT 280 11/21/2012   GLUCOSE 92 11/21/2012   CHOL 197 11/21/2012   TRIG 73.0 11/21/2012   HDL 105.30 11/21/2012   LDLDIRECT 101.6 12/18/2008   LDLCALC 77 11/21/2012   ALT 37* 11/21/2012   AST 60* 11/21/2012   NA 138 11/21/2012   K 3.9 11/21/2012   CL 96 11/21/2012   CREATININE 0.7 11/21/2012   BUN 9 11/21/2012   CO2 31 11/21/2012   TSH 0.80 11/21/2012   INR 0.9 ratio 12/18/2008  \  Review of Systems  Constitutional: Negative for chills, activity change, appetite change, fatigue and unexpected weight change.  HENT: Negative for congestion, mouth sores and sinus pressure.   Eyes: Negative for visual disturbance.  Respiratory: Negative for cough and chest tightness.   Gastrointestinal: Negative for nausea and abdominal pain.  Genitourinary: Negative for frequency, difficulty urinating and vaginal pain.  Musculoskeletal: Negative for back pain and gait problem.  Skin: Negative for pallor and rash.  Neurological: Negative for dizziness, tremors, weakness, numbness and headaches.  Psychiatric/Behavioral: Negative for confusion and sleep disturbance.       Objective:   Physical Exam  Constitutional: She appears well-developed and  well-nourished. No distress.  HENT:  Head: Normocephalic.  Right Ear: External ear normal.  Left Ear: External ear normal.  Nose: Nose normal.  Mouth/Throat: Oropharynx is clear and moist.  Eyes: Conjunctivae are normal. Pupils are equal, round, and reactive to light. Right eye exhibits no discharge. Left eye exhibits no discharge.  Neck: Normal range of motion. Neck supple. No JVD present. No tracheal deviation present. No thyromegaly present.  Cardiovascular: Normal rate, regular rhythm and normal heart sounds.   Pulmonary/Chest: No stridor. No respiratory distress. She has no wheezes.  Abdominal: Soft. Bowel sounds are normal. She exhibits no distension and no mass. There is no tenderness. There is no rebound and no guarding.  Musculoskeletal: She exhibits no edema or tenderness.  Lymphadenopathy:    She has no cervical adenopathy.  Neurological: She displays normal reflexes. No cranial nerve deficit. She exhibits normal muscle tone. Coordination normal.  Skin: No rash noted. No erythema.  Psychiatric: She has a normal mood and affect. Her behavior is normal. Judgment and thought content normal.  R post chest wall soft mass 6x8 cm  over R shoulder blade, tender    Lab Results  Component Value Date   WBC 4.6 11/21/2012   HGB 15.7* 11/21/2012   HCT 46.0 11/21/2012   PLT 280 11/21/2012   GLUCOSE 92 11/21/2012   CHOL 197 11/21/2012   TRIG 73.0 11/21/2012  HDL 105.30 11/21/2012   LDLDIRECT 101.6 12/18/2008   LDLCALC 77 11/21/2012   ALT 37* 11/21/2012   AST 60* 11/21/2012   NA 138 11/21/2012   K 3.9 11/21/2012   CL 96 11/21/2012   CREATININE 0.7 11/21/2012   BUN 9 11/21/2012   CO2 31 11/21/2012   TSH 0.80 11/21/2012   INR 0.9 ratio 12/18/2008          Assessment & Plan:

## 2014-07-27 NOTE — Assessment & Plan Note (Signed)
On Rx: Atenolol HCT

## 2014-07-27 NOTE — Assessment & Plan Note (Signed)
11/14 s/p remote contusion R post chest wall soft mass 8x6 cm over R shoulder blade, tender --- ?lipoma Dr Cay Schillings

## 2014-08-24 DIAGNOSIS — Z0289 Encounter for other administrative examinations: Secondary | ICD-10-CM

## 2014-08-27 ENCOUNTER — Ambulatory Visit: Payer: Self-pay | Admitting: Internal Medicine

## 2014-09-09 ENCOUNTER — Ambulatory Visit
Admission: RE | Admit: 2014-09-09 | Discharge: 2014-09-09 | Disposition: A | Discharge status: Home / Self Care | Payer: Medicare Other | Source: Ambulatory Visit | Attending: Physician Assistant | Admitting: Physician Assistant

## 2014-09-09 ENCOUNTER — Telehealth: Payer: Self-pay | Admitting: Physician Assistant

## 2014-09-09 ENCOUNTER — Ambulatory Visit (INDEPENDENT_AMBULATORY_CARE_PROVIDER_SITE_OTHER): Payer: Medicare Other | Admitting: Emergency Medicine

## 2014-09-09 ENCOUNTER — Ambulatory Visit (INDEPENDENT_AMBULATORY_CARE_PROVIDER_SITE_OTHER): Payer: Medicare Other

## 2014-09-09 VITALS — BP 114/88 | HR 48 | Temp 97.6°F | Resp 16 | Ht 65.25 in | Wt 110.8 lb

## 2014-09-09 DIAGNOSIS — R1310 Dysphagia, unspecified: Secondary | ICD-10-CM | POA: Diagnosis not present

## 2014-09-09 DIAGNOSIS — R918 Other nonspecific abnormal finding of lung field: Secondary | ICD-10-CM | POA: Diagnosis not present

## 2014-09-09 DIAGNOSIS — I7789 Other specified disorders of arteries and arterioles: Secondary | ICD-10-CM

## 2014-09-09 DIAGNOSIS — E041 Nontoxic single thyroid nodule: Secondary | ICD-10-CM

## 2014-09-09 DIAGNOSIS — Z87891 Personal history of nicotine dependence: Secondary | ICD-10-CM | POA: Diagnosis not present

## 2014-09-09 DIAGNOSIS — R1013 Epigastric pain: Secondary | ICD-10-CM | POA: Diagnosis not present

## 2014-09-09 DIAGNOSIS — R001 Bradycardia, unspecified: Secondary | ICD-10-CM

## 2014-09-09 LAB — POCT CBC
GRANULOCYTE PERCENT: 30.1 % — AB (ref 37–80)
HCT, POC: 48.3 % — AB (ref 37.7–47.9)
Hemoglobin: 14.5 g/dL (ref 12.2–16.2)
Lymph, poc: 2.8 (ref 0.6–3.4)
MCH, POC: 28.5 pg (ref 27–31.2)
MCHC: 30.1 g/dL — AB (ref 31.8–35.4)
MCV: 94.6 fL (ref 80–97)
MID (cbc): 0.4 (ref 0–0.9)
MPV: 7.8 fL (ref 0–99.8)
POC Granulocyte: 0.4 — AB (ref 2–6.9)
POC LYMPH PERCENT: 60.7 %L — AB (ref 10–50)
POC MID %: 9.2 % (ref 0–12)
Platelet Count, POC: 251 10*3/uL (ref 142–424)
RBC: 5.1 M/uL (ref 4.04–5.48)
RDW, POC: 15.6 %
WBC: 4.6 10*3/uL (ref 4.6–10.2)

## 2014-09-09 MED ORDER — OMEPRAZOLE 40 MG PO CPDR: 40.0000 mg | DELAYED_RELEASE_CAPSULE | Freq: Every day | ORAL | Status: DC

## 2014-09-09 NOTE — Patient Instructions (Addendum)
We are concerned about your recent symptoms. We have referred you urgently to [facility] today, they'll be in contact with you to schedule.  In the meantime, please just consume a liquid/soft foods diet. I have sent in omeprazole 40 mg to the pharmacy. Please take this once daily until you see the GI team.  Your chest xray showed a wide aorta today, this could also be causing your symptoms. You need to have a CT scan of your chest. Please go to: You have a CT scheduled today at [facility]. At 12:40 PM

## 2014-09-09 NOTE — Progress Notes (Signed)
Subjective:    Patient ID: Diana Barker, female    DOB: Apr 18, 1946, 69 y.o.   MRN: [id number]  Chief Complaint  Patient presents with  . Abdominal Pain    x 1 week - got worse yesterday   Patient Active Problem List   Diagnosis Date Noted  . Cough 06/23/2014  . Chest wall hematoma 03/12/2013  . Herpes zoster 03/12/2013  . Chest wall contusion 12/06/2012  . Elbow pain 12/06/2012  . Left leg pain 06/19/2011  . LBP (low back pain) 06/19/2011  . Hypokalemia 12/25/2010  . Alcohol-induced pancreatitis 12/16/2010  . Cystic disease of liver 12/16/2010  . Vertigo 10/13/2010  . COLON CANCER, HX OF 02/23/2009  . NEOPLASM UNSPECIFIED NATURE DIGESTIVE SYSTEM 12/18/2008  . ALCOHOLISM 12/18/2008  . ABDOMINAL PAIN 12/18/2008  . Tobacco use disorder 02/24/2008  . Essential hypertension 12/08/2006   Prior to Admission medications   Medication Sig Start Date End Date Taking? Authorizing Provider  aspirin 81 MG tablet Take 81 mg by mouth daily.     Yes Historical Provider, MD  atenolol-chlorthalidone (TENORETIC) 50-25 MG per tablet Take 1 tablet by mouth daily. 07/21/14  Yes Aleksei Plotnikov V, MD  Cholecalciferol (VITAMIN D3) 1000 UNITS tablet Take 2,000 Units by mouth daily.     Yes Historical Provider, MD  Cyanocobalamin (VITAMIN B-12) 1000 MCG SUBL Place 1 tablet (1,000 mcg total) under the tongue daily. 11/24/12  Yes Aleksei Plotnikov V, MD  omeprazole (PRILOSEC) 40 MG capsule Take 1 capsule (40 mg total) by mouth daily. 09/09/14   Araceli Bouche, PA   Medications, allergies, past medical history, surgical history, family history, social history and problem list reviewed and updated.  HPI  73 yof presents with dysphagia past 5 days.   Feels like food has been getting stuck past 5 days. Able to drink, get liquid food down but solids feel like they get caught. Ongoing 5 days. No epigastric pain but feels uncomfortable in area since dysphagia started. No odynophagia.   Denies exertional  cp, sob, ruq pain after meals, reflux sx, early satiety, blood in stool. No hx gerd. Has never taken acid medication. Takes aleve occasionally, not daily. Has not used in week or so. No wt loss. No night sweats.   50 pack year smoking hx. Drinks 6-7 beers week. Has for many yrs.   Colon cancer in 2010. Had chemo and section bowel removed.   Low HR noted today. Pt on 50 atenolol.   Review of Systems No fevers, chills.     Objective:   Physical Exam  Constitutional: She is oriented to person, place, and time. She appears well-developed and well-nourished.  Non-toxic appearance. She does not have a sickly appearance. She does not appear ill. No distress.  BP 114/88 mmHg  Pulse 48  Temp(Src) 97.6 F (36.4 C) (Oral)  Resp 16  Ht 5' 5.25" (1.657 m)  Wt 110 lb 12.8 oz (50.259 kg)  BMI 18.30 kg/m2  SpO2 98%   Cardiovascular: Normal rate, regular rhythm and normal heart sounds.  Exam reveals no gallop.   No murmur heard. Pulmonary/Chest: Effort normal and breath sounds normal.  Abdominal: Soft. Normal appearance, normal aorta and bowel sounds are normal. There is no tenderness. There is no rigidity, no rebound, no guarding, no tenderness at McBurney's point and negative Murphy's sign. Hernia confirmed negative in the ventral area.  Neurological: She is alert and oriented to person, place, and time.  Psychiatric: She has a normal mood and affect. Her  speech is normal and behavior is normal.   [facility] reading (PRIMARY) by  Dr. Everlene Farrier. Findings: Please comment on aortic root on lateral which appears wide.    Results for orders placed or performed in visit on 09/09/14  POCT CBC  Result Value Ref Range   WBC 4.6 4.6 - 10.2 K/uL   Lymph, poc 2.8 0.6 - 3.4   POC LYMPH PERCENT 60.7 (A) 10 - 50 %L   MID (cbc) 0.4 0 - 0.9   POC MID % 9.2 0 - 12 %M   POC Granulocyte 0.4 (A) 2 - 6.9   Granulocyte percent 30.1 (A) 37 - 80 %G   RBC 5.10 4.04 - 5.48 M/uL   Hemoglobin 14.5 12.2 - 16.2 g/dL   HCT, POC  48.3 (A) 37.7 - 47.9 %   MCV 94.6 80 - 97 fL   MCH, POC 28.5 27 - 31.2 pg   MCHC 30.1 (A) 31.8 - 35.4 g/dL   RDW, POC 15.6 %   Platelet Count, POC 251 142 - 424 K/uL   MPV 7.8 0 - 99.8 fL      Assessment & Plan:   89 yof presents with dysphagia past 5 days.   Dysphagia - Plan: DG Chest 2 View, POCT CBC, Comprehensive metabolic panel, Lipase, Amylase, omeprazole (PRILOSEC) 40 MG capsule Epigastric discomfort - Plan: Comprehensive metabolic panel, Lipase, Amylase --cmp, cbc, lipase, amylase today --doubt cardiac etiology as no actual cp, no exertional cp, no sob, no recent fatigue --no hx gerd, reflux sx --concern for esophageal malignancy --> dysphagia, smoking/alcohol hx, hx colon ca --start 40 omeprazole today --referred urgently to [facility] whom she has seen prev, liquid diet until then --** cxr done in house showed widening of proximal ascending thoracic aorta, radiology over read recommended contrast chest ct, pt left here to go to [facility] for CT**  Bradycardia --on 50 atenolol, regular rate --instructed to f/u with pcp  Julieta Gutting, PA-C Physician Assistant-Certified [facility]  09/09/2014 11:41 AM

## 2014-09-09 NOTE — Telephone Encounter (Signed)
Called and spoke with pt. Informed her of normal aortic size on chest ct. Not likely cause of her dysphagia sx. Will await for her referral to GI. Noted thyroid nodule on ct. Will order [place] to be completed after GI referral.

## 2014-09-10 LAB — COMPREHENSIVE METABOLIC PANEL
ALT: 45 U/L — ABNORMAL HIGH (ref 0–35)
AST: 49 U/L — ABNORMAL HIGH (ref 0–37)
Albumin: 4.3 g/dL (ref 3.5–5.2)
Alkaline Phosphatase: 61 U/L (ref 39–117)
BUN: 14 mg/dL (ref 6–23)
CO2: 30 mEq/L (ref 19–32)
CREATININE: 0.7 mg/dL (ref 0.50–1.10)
Calcium: 10 mg/dL (ref 8.4–10.5)
Chloride: 97 mEq/L (ref 96–112)
Glucose, Bld: 89 mg/dL (ref 70–99)
Potassium: 3.3 mEq/L — ABNORMAL LOW (ref 3.5–5.3)
Sodium: 138 mEq/L (ref 135–145)
Total Bilirubin: 0.8 mg/dL (ref 0.2–1.2)
Total Protein: 8.2 g/dL (ref 6.0–8.3)

## 2014-09-10 LAB — LIPASE: Lipase: 87 U/L — ABNORMAL HIGH (ref 0–75)

## 2014-09-10 LAB — AMYLASE: Amylase: 47 U/L (ref 0–105)

## 2014-09-15 ENCOUNTER — Ambulatory Visit
Admission: RE | Admit: 2014-09-15 | Discharge: 2014-09-15 | Disposition: A | Discharge status: Home / Self Care | Payer: Medicare Other | Source: Ambulatory Visit | Attending: Physician Assistant | Admitting: Physician Assistant

## 2014-09-15 DIAGNOSIS — E041 Nontoxic single thyroid nodule: Secondary | ICD-10-CM

## 2014-09-15 DIAGNOSIS — E042 Nontoxic multinodular goiter: Secondary | ICD-10-CM | POA: Diagnosis not present

## 2014-09-18 ENCOUNTER — Encounter: Payer: Self-pay | Admitting: *Deleted

## 2014-09-21 ENCOUNTER — Encounter: Payer: Self-pay | Admitting: Physician Assistant

## 2014-09-21 ENCOUNTER — Ambulatory Visit (INDEPENDENT_AMBULATORY_CARE_PROVIDER_SITE_OTHER): Payer: Medicare Other | Admitting: Physician Assistant

## 2014-09-21 ENCOUNTER — Ambulatory Visit: Payer: Self-pay | Admitting: Internal Medicine

## 2014-09-21 VITALS — BP 96/62 | HR 68 | Ht 66.0 in | Wt 109.2 lb

## 2014-09-21 DIAGNOSIS — R1013 Epigastric pain: Secondary | ICD-10-CM | POA: Diagnosis not present

## 2014-09-21 DIAGNOSIS — R1314 Dysphagia, pharyngoesophageal phase: Secondary | ICD-10-CM

## 2014-09-21 DIAGNOSIS — Z0289 Encounter for other administrative examinations: Secondary | ICD-10-CM

## 2014-09-21 DIAGNOSIS — R1310 Dysphagia, unspecified: Secondary | ICD-10-CM

## 2014-09-21 MED ORDER — OMEPRAZOLE 20 MG PO CPDR: 20.0000 mg | DELAYED_RELEASE_CAPSULE | Freq: Two times a day (BID) | ORAL | Status: DC

## 2014-09-21 NOTE — Progress Notes (Signed)
Agree with initial assessment and plans 

## 2014-09-21 NOTE — Progress Notes (Signed)
Patient ID: Diana Barker, female   DOB: [birth year], 69 y.o.   MRN: [id number]   Subjective:    Patient ID: Diana Barker, female    DOB: November 15, 1945, 69 y.o.   MRN: [id number]  HPI  Diana Barker is a 69 year old African-American female known to Dr. Henrene Pastor from prior colonoscopy. Patient has history of colon cancer diagnosed in 2010 and is status post right hemicolectomy. She has had serial colonoscopies the last in June 2015. She was found to have a diminutive polyp in the transverse colon which was a tubular adenoma. She is and for 5 year interval follow-up.   she also has history of hypertension, chronic EtOH use and prior history of pancreatitis I believe in 2012 and in 2014. She is referred today by Dr. Osvaldo Angst at urgent care for evaluation of new onset of dysphagia and odynophagia. She underwent CT scan of the chest on 09/09/2014 which showed a large cystic structure in the left upper quadrant stable since 2011 [place] mesenteric cyst or pseudocyst otherwise negative exam of the chest. Patient says she's been unable to swallow solid food over the past 2 weeks and has been eating very soft or thick liquid consistencies. She says the heaviest she can swallow is scrambled eggs. She's having no difficulty with liquids. He heartburn or indigestion but says when she swallows solid food she gets pain in the subxiphoid area and has to stop eating. She has a sense that the food is sticking and says it hurts. 's not had any regurgitation, no nausea or vomiting and no complaints of abdominal pain. She says her appetite is fine and she has not lost any significant amount of weight. She has not had prior EGD. Patient is a smoker and drinks alcohol on a regular basis though denies daily use.  Review of Systems Pertinent positive and negative review of systems were noted in the above HPI section.  All other review of systems was otherwise negative.  Outpatient Encounter Prescriptions as of 09/21/2014  Medication  Sig  . aspirin 81 MG tablet Take 81 mg by mouth daily.    Marland Kitchen atenolol-chlorthalidone (TENORETIC) 50-25 MG per tablet Take 1 tablet by mouth daily.  . Cholecalciferol (VITAMIN D3) 1000 UNITS tablet Take 2,000 Units by mouth daily.    . Cyanocobalamin (VITAMIN B-12) 1000 MCG SUBL Place 1 tablet (1,000 mcg total) under the tongue daily.  Marland Kitchen omeprazole (PRILOSEC) 20 MG capsule Take 1 capsule (20 mg total) by mouth 2 (two) times daily before a meal.  . [DISCONTINUED] omeprazole (PRILOSEC) 40 MG capsule Take 1 capsule (40 mg total) by mouth daily. (Patient taking differently: Take 40 mg by mouth as needed. )   No facility-administered encounter medications on file as of 09/21/2014.   No Known Allergies Patient Active Problem List   Diagnosis Date Noted  . Cough 06/23/2014  . Chest wall hematoma 03/12/2013  . Herpes zoster 03/12/2013  . Chest wall contusion 12/06/2012  . Elbow pain 12/06/2012  . Left leg pain 06/19/2011  . LBP (low back pain) 06/19/2011  . Hypokalemia 12/25/2010  . Alcohol-induced pancreatitis 12/16/2010  . Cystic disease of liver 12/16/2010  . Vertigo 10/13/2010  . COLON CANCER, HX OF 02/23/2009  . NEOPLASM UNSPECIFIED NATURE DIGESTIVE SYSTEM 12/18/2008  . ALCOHOLISM 12/18/2008  . ABDOMINAL PAIN 12/18/2008  . Tobacco use disorder 02/24/2008  . Essential hypertension 12/08/2006   History   Social History  . Marital Status: Single    Spouse Name: N/A  . Number of  Children: N/A  . Years of Education: N/A   Occupational History  . Dietary Aid    Social History Main Topics  . Smoking status: Current Every Day Smoker -- 1.00 packs/day    Types: Cigarettes  . Smokeless tobacco: Never Used  . Alcohol Use: 16.8 oz/week    28 Cans of beer per week     Comment: 4 beers/d  . Drug Use: No  . Sexual Activity: Not on file   Other Topics Concern  . Not on file   Social History Narrative    Ms. Derocher's family history includes Hypertension in an other family member.  There is no history of Colon cancer. She was adopted.      Objective:    Filed Vitals:   09/21/14 1342  BP: 96/62  Pulse: 68    Physical Exam   Well-developed thin older African-American female in no acute distress, pleasant accompanied by her daughter blood pressure 96/62 pulse 68 height 5 foot 6 weight 109. HEENT; nontraumatic normocephalic EOMI PERRLA sclera anicteric, Supple ;no JVD, Cardiovascular; regular rate and rhythm with S1-S2 no murmur rub or gallop, Pulmonary clear bilaterally, Abdomen; soft, nondistended ,bowel sounds are active there is no palpable mass or hepatosplenomegaly, Rectal; exam not done, Extremities ;no clubbing cyanosis or edema skin warm and dry, Psych ;mood and affect appropriate       Assessment & Plan:   #1 69 yo female with new onset of solid food dysphagia and odynophagia x 2-3 weeks R/O malignancy, R/O acute esophagitis/stricture #2 hx of colon cancer 2010-s/p right hemicolectomy #3 hx of adenomatous polyps #4 Hx of pancreatitis /likley ETOH- and probable chronic pseudocyst LUQ-asymptomatic  Plan; Prilosec 20 mg BID x one month then qam  Schedule for EGD with possible dilation with Dr Henrene Pastor. Procedure discussed in detail with pt and she  is agreeable to proceed.    Cha Gomillion S Charlese Gruetzmacher PA-C 09/21/2014   Cc: Araceli Bouche, PA

## 2014-09-21 NOTE — Patient Instructions (Addendum)
Take the Prilosec 20 mg , 1 tab ( Before breakfast and dinner), twice daily.  You have been scheduled for an endoscopy. Please follow written instructions given to you at your visit today. If you use inhalers (even only as needed), please bring them with you on the day of your procedure. Your physician has requested that you go to www.startemmi.com and enter the access code given to you at your visit today. This web site gives a general overview about your procedure. However, you should still follow specific instructions given to you by our office regarding your preparation for the procedure.

## 2014-09-22 ENCOUNTER — Telehealth: Payer: Self-pay | Admitting: Internal Medicine

## 2014-09-22 NOTE — Telephone Encounter (Signed)
Patient no showed for fu on 5/23.  Please advise.

## 2014-09-23 ENCOUNTER — Telehealth: Payer: Self-pay | Admitting: *Deleted

## 2014-09-23 NOTE — Telephone Encounter (Signed)
Noted. Thx.

## 2014-09-23 NOTE — Telephone Encounter (Signed)
Dr. Alain Marion, this pt was seen at our office ([facility]).  We did a number of test on her that include:  X-ray, CT, and an Ultrasound.  Since you are listed as her PCP, we figure you would want to know what has been going on.

## 2014-09-29 ENCOUNTER — Encounter: Payer: Self-pay | Admitting: Internal Medicine

## 2014-09-29 ENCOUNTER — Ambulatory Visit (AMBULATORY_SURGERY_CENTER): Payer: Medicare Other | Admitting: Internal Medicine

## 2014-09-29 VITALS — BP 123/76 | HR 55 | Temp 96.5°F | Resp 44 | Ht 66.0 in | Wt 109.0 lb

## 2014-09-29 DIAGNOSIS — K222 Esophageal obstruction: Secondary | ICD-10-CM | POA: Diagnosis not present

## 2014-09-29 DIAGNOSIS — R1314 Dysphagia, pharyngoesophageal phase: Secondary | ICD-10-CM | POA: Diagnosis present

## 2014-09-29 DIAGNOSIS — R1310 Dysphagia, unspecified: Secondary | ICD-10-CM | POA: Diagnosis not present

## 2014-09-29 MED ORDER — SODIUM CHLORIDE 0.9 % IV SOLN: 500.0000 mL | INTRAVENOUS | Status: DC

## 2014-09-29 NOTE — Progress Notes (Signed)
Called to room to assist during endoscopic procedure.  Patient ID and intended procedure confirmed with present staff. Received instructions for my participation in the procedure from the performing physician.  

## 2014-09-29 NOTE — Patient Instructions (Signed)
YOU HAD AN ENDOSCOPIC PROCEDURE TODAY AT [facility]:   Refer to the procedure report that was given to you for any specific questions about what was found during the examination.  If the procedure report does not answer your questions, please call your gastroenterologist to clarify.  If you requested that your care partner not be given the details of your procedure findings, then the procedure report has been included in a sealed envelope for you to review at your convenience later.  YOU SHOULD EXPECT: Some feelings of bloating in the abdomen. Passage of more gas than usual.  Walking can help get rid of the air that was put into your GI tract during the procedure and reduce the bloating. If you had a lower endoscopy (such as a colonoscopy or flexible sigmoidoscopy) you may notice spotting of blood in your stool or on the toilet paper. If you underwent a bowel prep for your procedure, you may not have a normal bowel movement for a few days.  Please Note:  You might notice some irritation and congestion in your nose or some drainage.  This is from the oxygen used during your procedure.  There is no need for concern and it should clear up in a day or so.  SYMPTOMS TO REPORT IMMEDIATELY:     Following upper endoscopy (EGD)  Vomiting of blood or coffee ground material  New chest pain or pain under the shoulder blades  Painful or persistently difficult swallowing  New shortness of breath  Fever of 100F or higher  Black, tarry-looking stools  For urgent or emergent issues, a gastroenterologist can be reached at any hour by calling [phone].   DIET:See Dilation Diet Instructions .  Drink plenty of fluids but you should avoid alcoholic beverages for 24 hours.  ACTIVITY:  You should plan to take it easy for the rest of today and you should NOT DRIVE or use heavy machinery until tomorrow (because of the sedation medicines used during the test).    FOLLOW UP: Our staff will  call the number listed on your records the next business day following your procedure to check on you and address any questions or concerns that you may have regarding the information given to you following your procedure. If we do not reach you, we will leave a message.  However, if you are feeling well and you are not experiencing any problems, there is no need to return our call.  We will assume that you have returned to your regular daily activities without incident.  If any biopsies were taken you will be contacted by phone or by letter within the next 1-3 weeks.  Please call us at [phone] if you have not heard about the biopsies in 3 weeks.    SIGNATURES/CONFIDENTIALITY: You and/or your care partner have signed paperwork which will be entered into your electronic medical record.  These signatures attest to the fact that that the information above on your After Visit Summary has been reviewed and is understood.  Full responsibility of the confidentiality of this discharge information lies with you and/or your care-partner.  Dilation diet given Continue Prilosec Call office to schedule appointment

## 2014-09-29 NOTE — Op Note (Signed)
[place]  [address] [place], [id number]   5ENDOSCOPY PROCEDURE REPORT  PATIENT: Diana Barker, Diana Barker  MR#: [id number] BIRTHDATE: 1945/12/08 , 87  yrs. old GENDER: female ENDOSCOPIST: Eustace Quail, MD REFERRED BY:  Arlyss Queen, M.D. PROCEDURE DATE:  09/29/2014 PROCEDURE:  EGD, diagnostic and Maloney dilation of esophagus  ?" 52 [place] ASA CLASS:     Class II INDICATIONS:  dysphagia. MEDICATIONS: Monitored anesthesia care and Propofol 130 mg IV TOPICAL ANESTHETIC: none  DESCRIPTION OF PROCEDURE: After the risks benefits and alternatives of the procedure were thoroughly explained, informed consent was obtained.  The LB [id number] endoscope was introduced through the mouth and advanced to the second portion of the duodenum , Without limitations.  The instrument was slowly withdrawn as the mucosa was fully examined.   EXAM:The esophagus revealed a large caliber distal ring but was otherwise normal.  No inflammation or tumor.  Stomach was normal. The duodenum was normal.  Retroflexed views revealed no abnormalities.     The scope was then withdrawn from the patient and the procedure completed. THERAPY: 72 French Maloney dilator was passed into the esophagus without resistance or heme. Tolerated well  COMPLICATIONS: There were no immediate complications.  ENDOSCOPIC IMPRESSION: 1. Large caliber benign distal ring status post dilation to 52 French 2. Otherwise normal EGD  RECOMMENDATIONS: 1.  Clear liquids until 12 noon, then soft foods rest of day. Resume prior diet tomorrow. 2.  Continue Prilosec (omeprazole) daily 3.  Call office next 2-3 days to schedule an office appointment for 6 weeks with Dr. Henrene Pastor  REPEAT EXAM:  eSigned:  Eustace Quail, MD 09/29/2014 10:33 AM    QT:[id number] Everlene Farrier, MD, [place] [place].  Plotnikov, MD, and The Patient

## 2014-09-29 NOTE — Progress Notes (Signed)
To recovery, report to [place], VSS.

## 2014-09-30 ENCOUNTER — Telehealth: Payer: Self-pay

## 2014-09-30 NOTE — Telephone Encounter (Signed)
No answer. Voice mail not available to leave message.

## 2014-10-19 ENCOUNTER — Encounter: Payer: Self-pay | Admitting: Internal Medicine

## 2014-10-19 ENCOUNTER — Other Ambulatory Visit (INDEPENDENT_AMBULATORY_CARE_PROVIDER_SITE_OTHER): Payer: Medicare Other

## 2014-10-19 ENCOUNTER — Ambulatory Visit (INDEPENDENT_AMBULATORY_CARE_PROVIDER_SITE_OTHER): Payer: Medicare Other | Admitting: Internal Medicine

## 2014-10-19 VITALS — BP 130/90 | HR 63 | Wt 107.0 lb

## 2014-10-19 DIAGNOSIS — Z0000 Encounter for general adult medical examination without abnormal findings: Secondary | ICD-10-CM | POA: Diagnosis not present

## 2014-10-19 DIAGNOSIS — S20211S Contusion of right front wall of thorax, sequela: Secondary | ICD-10-CM

## 2014-10-19 DIAGNOSIS — I10 Essential (primary) hypertension: Secondary | ICD-10-CM

## 2014-10-19 DIAGNOSIS — Z79899 Other long term (current) drug therapy: Secondary | ICD-10-CM

## 2014-10-19 DIAGNOSIS — Z720 Tobacco use: Secondary | ICD-10-CM | POA: Diagnosis not present

## 2014-10-19 DIAGNOSIS — F10288 Alcohol dependence with other alcohol-induced disorder: Secondary | ICD-10-CM

## 2014-10-19 DIAGNOSIS — F17200 Nicotine dependence, unspecified, uncomplicated: Secondary | ICD-10-CM

## 2014-10-19 DIAGNOSIS — R1314 Dysphagia, pharyngoesophageal phase: Secondary | ICD-10-CM

## 2014-10-19 LAB — LIPID PANEL
Cholesterol: 215 mg/dL — ABNORMAL HIGH (ref 0–200)
HDL: 84.7 mg/dL (ref >39.00)
LDL Cholesterol: 102 mg/dL — ABNORMAL HIGH (ref 0–99)
NonHDL: 130.3
TRIGLYCERIDES: 143 mg/dL (ref 0.0–149.0)
Total CHOL/HDL Ratio: 3
VLDL: 28.6 mg/dL (ref 0.0–40.0)

## 2014-10-19 LAB — HEPATIC FUNCTION PANEL
ALBUMIN: 4.3 g/dL (ref 3.5–5.2)
ALT: 37 U/L — ABNORMAL HIGH (ref 0–35)
AST: 49 U/L — ABNORMAL HIGH (ref 0–37)
Alkaline Phosphatase: 60 U/L (ref 39–117)
BILIRUBIN DIRECT: 0.1 mg/dL (ref 0.0–0.3)
Total Bilirubin: 0.6 mg/dL (ref 0.2–1.2)
Total Protein: 8.1 g/dL (ref 6.0–8.3)

## 2014-10-19 LAB — CBC WITH DIFFERENTIAL/PLATELET
BASOS PCT: 1 % (ref 0.0–3.0)
Basophils Absolute: 0 10*3/uL (ref 0.0–0.1)
EOS ABS: 0.1 10*3/uL (ref 0.0–0.7)
EOS PCT: 1.6 % (ref 0.0–5.0)
HEMATOCRIT: 48.6 % — AB (ref 36.0–46.0)
Hemoglobin: 15.9 g/dL — ABNORMAL HIGH (ref 12.0–15.0)
LYMPHS ABS: 2 10*3/uL (ref 0.7–4.0)
Lymphocytes Relative: 43.5 % (ref 12.0–46.0)
MCHC: 32.8 g/dL (ref 30.0–36.0)
MCV: 92.6 fl (ref 78.0–100.0)
MONO ABS: 0.4 10*3/uL (ref 0.1–1.0)
Monocytes Relative: 8.1 % (ref 3.0–12.0)
NEUTROS ABS: 2.1 10*3/uL (ref 1.4–7.7)
NEUTROS PCT: 45.8 % (ref 43.0–77.0)
Platelets: 315 10*3/uL (ref 150.0–400.0)
RBC: 5.24 Mil/uL — AB (ref 3.87–5.11)
RDW: 14.2 % (ref 11.5–15.5)
WBC: 4.7 10*3/uL (ref 4.0–10.5)

## 2014-10-19 LAB — BASIC METABOLIC PANEL
BUN: 11 mg/dL (ref 6–23)
CO2: 32 meq/L (ref 19–32)
CREATININE: 0.65 mg/dL (ref 0.40–1.20)
Calcium: 10.3 mg/dL (ref 8.4–10.5)
Chloride: 97 mEq/L (ref 96–112)
GFR: 115.72 mL/min (ref >60.00)
Glucose, Bld: 103 mg/dL — ABNORMAL HIGH (ref 70–99)
Potassium: 4.8 mEq/L (ref 3.5–5.1)
SODIUM: 136 meq/L (ref 135–145)

## 2014-10-19 LAB — URINALYSIS
BILIRUBIN URINE: NEGATIVE
Hgb urine dipstick: NEGATIVE
Ketones, ur: NEGATIVE
LEUKOCYTES UA: NEGATIVE
NITRITE: NEGATIVE
PH: 7.5 (ref 5.0–8.0)
Specific Gravity, Urine: 1.015 (ref 1.000–1.030)
Total Protein, Urine: NEGATIVE
URINE GLUCOSE: NEGATIVE
Urobilinogen, UA: 1 (ref 0.0–1.0)

## 2014-10-19 LAB — TSH: TSH: 0.71 u[IU]/mL (ref 0.35–4.50)

## 2014-10-19 LAB — VITAMIN D 25 HYDROXY (VIT D DEFICIENCY, FRACTURES): VITD: 62.61 ng/mL (ref 30.00–100.00)

## 2014-10-19 LAB — VITAMIN B12: VITAMIN B 12: 572 pg/mL (ref 211–911)

## 2014-10-19 MED ORDER — ATENOLOL-CHLORTHALIDONE 50-25 MG PO TABS: 1.0000 | ORAL_TABLET | Freq: Every day | ORAL | Status: DC

## 2014-10-19 NOTE — Assessment & Plan Note (Signed)
Refractory Discussed

## 2014-10-19 NOTE — Assessment & Plan Note (Signed)
Drinking less per pt Discussed

## 2014-10-19 NOTE — Progress Notes (Signed)
Pre visit review using our clinic review tool, if applicable. No additional management support is needed unless otherwise documented below in the visit note. 

## 2014-10-19 NOTE — Assessment & Plan Note (Signed)
6/16 Dr Henrene Pastor S/p dilatation On Prilosec

## 2014-10-19 NOTE — Progress Notes (Signed)
Subjective:   HPI  The patient is here for a wellness exam.   F/u lump on R upper back - not better - seeing Dr Cay Schillings (Ortho) H/o a fall at work on 12/04/12 - fell on a carpet floor. She went to see an occupational health doctor, had x rays (Dr Geoffry Paradise) - dx: trunk contusion. Now she still has a lump over R shoulder blade and it is tender to sleep on - pt is seeing Dr Cay Schillings who is her workman's comp doctor.  F/u dysphagia - just had a dilatation procedure F/u pancreatitis and abd pain (gone) F/u HTN and alcoholism. She is asking me her blood type - wants to know  Wt Readings from Last 3 Encounters:  10/19/14 107 lb (48.535 kg)  09/29/14 109 lb (49.442 kg)  09/21/14 109 lb 4 oz (49.555 kg)   BP Readings from Last 3 Encounters:  09/29/14 123/76  09/21/14 96/62  09/09/14 114/88      Lab Results  Component Value Date   WBC 4.6 09/09/2014   HGB 14.5 09/09/2014   HCT 48.3* 09/09/2014   PLT 280 11/21/2012   GLUCOSE 89 09/09/2014   CHOL 197 11/21/2012   TRIG 73.0 11/21/2012   HDL 105.30 11/21/2012   LDLDIRECT 101.6 12/18/2008   LDLCALC 77 11/21/2012   ALT 45* 09/09/2014   AST 49* 09/09/2014   NA 138 09/09/2014   K 3.3* 09/09/2014   CL 97 09/09/2014   CREATININE 0.70 09/09/2014   BUN 14 09/09/2014   CO2 30 09/09/2014   TSH 0.80 11/21/2012   INR 0.9 ratio 12/18/2008  \  Review of Systems  Constitutional: Negative for chills, activity change, appetite change, fatigue and unexpected weight change.  HENT: Negative for congestion, mouth sores and sinus pressure.   Eyes: Negative for visual disturbance.  Respiratory: Negative for cough and chest tightness.   Gastrointestinal: Negative for nausea and abdominal pain.  Genitourinary: Negative for frequency, difficulty urinating and vaginal pain.  Musculoskeletal: Negative for back pain and gait problem.  Skin: Negative for pallor and rash.  Neurological: Negative for dizziness, tremors, weakness, numbness and headaches.   Psychiatric/Behavioral: Negative for confusion and sleep disturbance.       Objective:   Physical Exam  Constitutional: She appears well-developed and well-nourished. No distress.  HENT:  Head: Normocephalic.  Right Ear: External ear normal.  Left Ear: External ear normal.  Nose: Nose normal.  Mouth/Throat: Oropharynx is clear and moist.  Eyes: Conjunctivae are normal. Pupils are equal, round, and reactive to light. Right eye exhibits no discharge. Left eye exhibits no discharge.  Neck: Normal range of motion. Neck supple. No JVD present. No tracheal deviation present. No thyromegaly present.  Cardiovascular: Normal rate, regular rhythm and normal heart sounds.   Pulmonary/Chest: No stridor. No respiratory distress. She has no wheezes.  Abdominal: Soft. Bowel sounds are normal. She exhibits no distension and no mass. There is no tenderness. There is no rebound and no guarding.  Musculoskeletal: She exhibits no edema or tenderness.  Lymphadenopathy:    She has no cervical adenopathy.  Neurological: She displays normal reflexes. No cranial nerve deficit. She exhibits normal muscle tone. Coordination normal.  Skin: No rash noted. No erythema.  Psychiatric: She has a normal mood and affect. Her behavior is normal. Judgment and thought content normal.  R post chest wall soft mass 6x7 cm  over R shoulder blade, tender    Lab Results  Component Value Date   WBC 4.6 09/09/2014  HGB 14.5 09/09/2014   HCT 48.3* 09/09/2014   PLT 280 11/21/2012   GLUCOSE 89 09/09/2014   CHOL 197 11/21/2012   TRIG 73.0 11/21/2012   HDL 105.30 11/21/2012   LDLDIRECT 101.6 12/18/2008   LDLCALC 77 11/21/2012   ALT 45* 09/09/2014   AST 49* 09/09/2014   NA 138 09/09/2014   K 3.3* 09/09/2014   CL 97 09/09/2014   CREATININE 0.70 09/09/2014   BUN 14 09/09/2014   CO2 30 09/09/2014   TSH 0.80 11/21/2012   INR 0.9 ratio 12/18/2008    EGD 09/29/14      Assessment & Plan:

## 2014-10-19 NOTE — Assessment & Plan Note (Signed)
Chronic. On Rx: Atenolol HCT BP Readings from Last 3 Encounters:  10/19/14 130/90  09/29/14 123/76  09/21/14 96/62

## 2014-10-19 NOTE — Assessment & Plan Note (Signed)
R post chest wall soft mass 8x6 cm over R shoulder blade, tender --- ?lipoma F/u w/Dr Cay Schillings

## 2014-10-19 NOTE — Assessment & Plan Note (Signed)
We discussed age appropriate health related issues, including available/recomended screening tests and vaccinations. We discussed a need for adhering to healthy diet and exercise. Labs/EKG were reviewed/ordered. All questions were answered.  Needs mammo, PAP, eye exam, colon

## 2014-11-11 ENCOUNTER — Ambulatory Visit: Payer: Self-pay | Admitting: Internal Medicine

## 2014-12-02 ENCOUNTER — Encounter: Payer: Self-pay | Admitting: *Deleted

## 2014-12-09 ENCOUNTER — Telehealth: Payer: Self-pay | Admitting: Geriatric Medicine

## 2014-12-09 NOTE — Telephone Encounter (Signed)
Left message asking patient to call me back to let me know if she has had a mammogram recently.  

## 2014-12-23 DIAGNOSIS — Z1231 Encounter for screening mammogram for malignant neoplasm of breast: Secondary | ICD-10-CM | POA: Diagnosis not present

## 2014-12-23 LAB — HM MAMMOGRAPHY

## 2014-12-24 ENCOUNTER — Encounter: Payer: Self-pay | Admitting: Internal Medicine

## 2015-02-03 ENCOUNTER — Encounter: Payer: Self-pay | Admitting: Internal Medicine

## 2015-03-11 ENCOUNTER — Encounter: Payer: Self-pay | Admitting: Internal Medicine

## 2015-11-27 ENCOUNTER — Other Ambulatory Visit: Payer: Self-pay | Admitting: Internal Medicine

## 2016-01-06 ENCOUNTER — Other Ambulatory Visit: Payer: Self-pay | Admitting: Internal Medicine

## 2016-02-02 ENCOUNTER — Encounter: Payer: Self-pay | Admitting: Internal Medicine

## 2016-02-02 ENCOUNTER — Ambulatory Visit (INDEPENDENT_AMBULATORY_CARE_PROVIDER_SITE_OTHER): Payer: Medicare Other | Admitting: Internal Medicine

## 2016-02-02 VITALS — BP 126/90 | HR 58 | Ht 65.75 in | Wt 108.0 lb

## 2016-02-02 DIAGNOSIS — D485 Neoplasm of uncertain behavior of skin: Secondary | ICD-10-CM | POA: Diagnosis not present

## 2016-02-02 DIAGNOSIS — M79642 Pain in left hand: Secondary | ICD-10-CM | POA: Insufficient documentation

## 2016-02-02 DIAGNOSIS — I10 Essential (primary) hypertension: Secondary | ICD-10-CM | POA: Diagnosis not present

## 2016-02-02 DIAGNOSIS — C186 Malignant neoplasm of descending colon: Secondary | ICD-10-CM | POA: Diagnosis not present

## 2016-02-02 DIAGNOSIS — Z0000 Encounter for general adult medical examination without abnormal findings: Secondary | ICD-10-CM

## 2016-02-02 DIAGNOSIS — F1029 Alcohol dependence with unspecified alcohol-induced disorder: Secondary | ICD-10-CM

## 2016-02-02 DIAGNOSIS — E785 Hyperlipidemia, unspecified: Secondary | ICD-10-CM

## 2016-02-02 DIAGNOSIS — L723 Sebaceous cyst: Secondary | ICD-10-CM

## 2016-02-02 DIAGNOSIS — F17200 Nicotine dependence, unspecified, uncomplicated: Secondary | ICD-10-CM

## 2016-02-02 MED ORDER — ATENOLOL-CHLORTHALIDONE 50-25 MG PO TABS: 1.0000 | ORAL_TABLET | Freq: Every day | ORAL | 3 refills | Status: DC

## 2016-02-02 NOTE — Assessment & Plan Note (Addendum)
Here for medicare wellness/physical  Diet: heart healthy  Physical activity: not sedentary  Depression/mood screen: negative  Hearing: intact to whispered voice  Visual acuity: grossly normal, performs annual eye exam  ADLs: capable  Fall risk: low  Home safety: good  Cognitive evaluation: intact to orientation, naming, recall and repetition  EOL planning: adv directives, full code/ I agree  I have personally reviewed and have noted  1. The patient's medical, surgical and social history  2. Their use of alcohol, tobacco or illicit drugs  3. Their current medications and supplements  4. The patient's functional ability including ADL's, fall risks, home safety risks and hearing or visual impairment.  5. Diet and physical activities  6. Evidence for depression or mood disorders - no 7. The roster of all physicians providing medical care to patient - is listed in the Snapshot section of the chart and reviewed today.    Today patient counseled on age appropriate routine health concerns for screening and prevention, each reviewed and up to date or declined. Immunizations reviewed and up to date or declined. Labs ordered and reviewed. Risk factors for depression reviewed and negative. Hearing function and visual acuity are intact. ADLs screened and addressed as needed. Functional ability and level of safety reviewed and appropriate. Education, counseling and referrals performed based on assessed risks today. Patient provided with a copy of personalized plan for preventive services.    Pt declined other health maintanance tests Mammo, colon  - pending

## 2016-02-02 NOTE — Assessment & Plan Note (Signed)
Derm ref or a procedure here were offered

## 2016-02-02 NOTE — Assessment & Plan Note (Signed)
Chronic OA/overuse Aleve prn

## 2016-02-02 NOTE — Assessment & Plan Note (Signed)
Dr Henrene Pastor: history of colon cancer diagnosed in 2010 and is status post right hemicolectomy. She has had serial colonoscopies the last in June 2015. She was found to have a diminutive polyp in the transverse colon which was a tubular adenoma. She is and for 5 year interval follow-up.

## 2016-02-02 NOTE — Assessment & Plan Note (Signed)
Discussed.

## 2016-02-02 NOTE — Progress Notes (Signed)
Pre visit review using our clinic review tool, if applicable. No additional management support is needed unless otherwise documented below in the visit note. 

## 2016-02-02 NOTE — Progress Notes (Signed)
Subjective:  Patient ID: Diana Barker, female    DOB: [birth year]  Age: 70 y.o. MRN: [id number]  CC: No chief complaint on file.   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 aspirin 81 MG tablet Take 81 mg by mouth daily.      Marland Kitchen atenolol-chlorthalidone (TENORETIC) 50-25 MG tablet TAKE 1 TABLET BY MOUTH DAILY. 30 tablet 0  . Cholecalciferol (VITAMIN D3) 1000 UNITS tablet Take 2,000 Units by mouth daily.      . Cyanocobalamin (VITAMIN B-12) 1000 MCG SUBL Place 1 tablet (1,000 mcg total) under the tongue daily. 100 tablet 3  . omeprazole (PRILOSEC) 20 MG capsule 2 (two) times daily.     Marland Kitchen atenolol-chlorthalidone (TENORETIC) 50-25 MG tablet TAKE 1 TABLET BY MOUTH DAILY. (Patient not taking: Reported on 02/02/2016) 90 tablet 0   No facility-administered medications prior to visit.     ROS Review of Systems  Constitutional: Negative for activity change, appetite change, chills, fatigue and unexpected weight change.  HENT: Negative for congestion, mouth sores and sinus pressure.   Eyes: Negative for visual disturbance.  Respiratory: Negative for cough and chest tightness.   Gastrointestinal: Negative for abdominal pain and nausea.  Genitourinary: Negative for difficulty urinating, frequency and vaginal pain.  Musculoskeletal: Positive for arthralgias. Negative for back pain and gait problem.  Skin: Negative for pallor and rash.  Neurological: Negative for dizziness, tremors, weakness, numbness and headaches.  Psychiatric/Behavioral: Negative for confusion and sleep disturbance. The patient is not nervous/anxious.     Objective:  BP 126/90   Pulse (!) 58   Ht 5' 5.75" (1.67 m)   Wt 108 lb (49 kg)   SpO2 98%   BMI 17.56 kg/m   BP Readings from Last 3 Encounters:  02/02/16 126/90  10/19/14 130/90  09/29/14 123/76    Wt Readings from Last 3  Encounters:  02/02/16 108 lb (49 kg)  10/19/14 107 lb (48.5 kg)  09/29/14 109 lb (49.4 kg)    Physical Exam  Constitutional: She appears well-developed. No distress.  HENT:  Head: Normocephalic.  Right Ear: External ear normal.  Left Ear: External ear normal.  Nose: Nose normal.  Mouth/Throat: Oropharynx is clear and moist.  Eyes: Conjunctivae are normal. Pupils are equal, round, and reactive to light. Right eye exhibits no discharge. Left eye exhibits no discharge.  Neck: Normal range of motion. Neck supple. No JVD present. No tracheal deviation present. No thyromegaly present.  Cardiovascular: Normal rate, regular rhythm and normal heart sounds.   Pulmonary/Chest: No stridor. No respiratory distress. She has no wheezes.  Abdominal: Soft. Bowel sounds are normal. She exhibits no distension and no mass. There is no tenderness. There is no rebound and no guarding.  Musculoskeletal: She exhibits tenderness. She exhibits no edema.  Lymphadenopathy:    She has no cervical adenopathy.  Neurological: She displays normal reflexes. No cranial nerve deficit. She exhibits normal muscle tone. Coordination normal.  Skin: No rash noted. No erythema.  Psychiatric: She has a normal mood and affect. Her behavior is normal. Judgment and thought content normal.  67mm cyst on R nose saddle  Lab Results  Component Value Date   WBC 4.7 10/19/2014   HGB 15.9 (H) 10/19/2014   HCT 48.6 (H) 10/19/2014   PLT 315.0 10/19/2014   GLUCOSE 103 (H) 10/19/2014   CHOL 215 (H)  10/19/2014   TRIG 143.0 10/19/2014   HDL 84.70 10/19/2014   LDLDIRECT 101.6 12/18/2008   LDLCALC 102 (H) 10/19/2014   ALT 37 (H) 10/19/2014   AST 49 (H) 10/19/2014   NA 136 10/19/2014   K 4.8 10/19/2014   CL 97 10/19/2014   CREATININE 0.65 10/19/2014   BUN 11 10/19/2014   CO2 32 10/19/2014   TSH 0.71 10/19/2014   INR 0.9 ratio 12/18/2008    US Soft Tissue Head/neck  Result Date: 09/15/2014 CLINICAL DATA:  70 year old female  with left thyroid nodule seen on CT scan of the chest EXAM: THYROID ULTRASOUND TECHNIQUE: Ultrasound examination of the thyroid gland and adjacent soft tissues was performed. COMPARISON:  CT scan of the chest 09/09/2014 FINDINGS: Right thyroid lobe Measurements: 4.8 x 1.4 x 1.9 cm. Hypoechoic solid 14 x 10 x 7 mm nodule in the deep aspect of the lower gland. A second, smaller 6 mm hypoechoic nodule is noted superficially within the upper gland. Additional tiny hypoechoic cysts versus nodules are scattered. Left thyroid lobe Measurements: 4.0 x 1.7 x 1.9 cm. Anechoic cystic lesion in the left mid gland measures 9 x 6 x 10 mm in corresponds with the hypoechoic lesion seen on the recent CT scan. There is an adjacent 6 mm solid hypoechoic nodule slightly superiorly and medially. Isthmus Thickness: 0.3 cm.  No nodules visualized. Lymphadenopathy None visualized. IMPRESSION: Multinodular thyroid gland containing a combination of solid hypoechoic nodules and thyroid cysts. The largest individual nodule measures up to 14 mm in the right lower pole. Findings do not meet current SRU consensus criteria for biopsy. Follow-up by clinical exam is recommended. If patient has known risk factors for thyroid carcinoma, consider follow-up ultrasound in 12 months. If patient is clinically hyperthyroid, consider nuclear medicine thyroid uptake and scan. Reference: Management of Thyroid Nodules Detected at [place]: Society of Radiologists in [place]. Radiology 2005; [id number]. Electronically Signed   By: Jacqulynn Cadet M.D.   On: 09/15/2014 12:38    Assessment & Plan:   There are no diagnoses linked to this encounter. I am having [name] maintain her aspirin, cholecalciferol, Vitamin B-12, omeprazole, and atenolol-chlorthalidone.  No orders of the defined types were placed in this encounter.    Follow-up: No Follow-up on file.  Walker Kehr, MD

## 2016-02-02 NOTE — Assessment & Plan Note (Signed)
meds renewed

## 2016-02-02 NOTE — Assessment & Plan Note (Signed)
Nose R ?cyst 2017

## 2016-05-19 IMAGING — CR DG CHEST 2V
2 series · 2 of 2 positions shown · non-contrast
Comparison: December 04, 2012

CLINICAL DATA: Shortness of breath and dysphagia for 5 days

EXAM:
CHEST  2 VIEW

[PA]
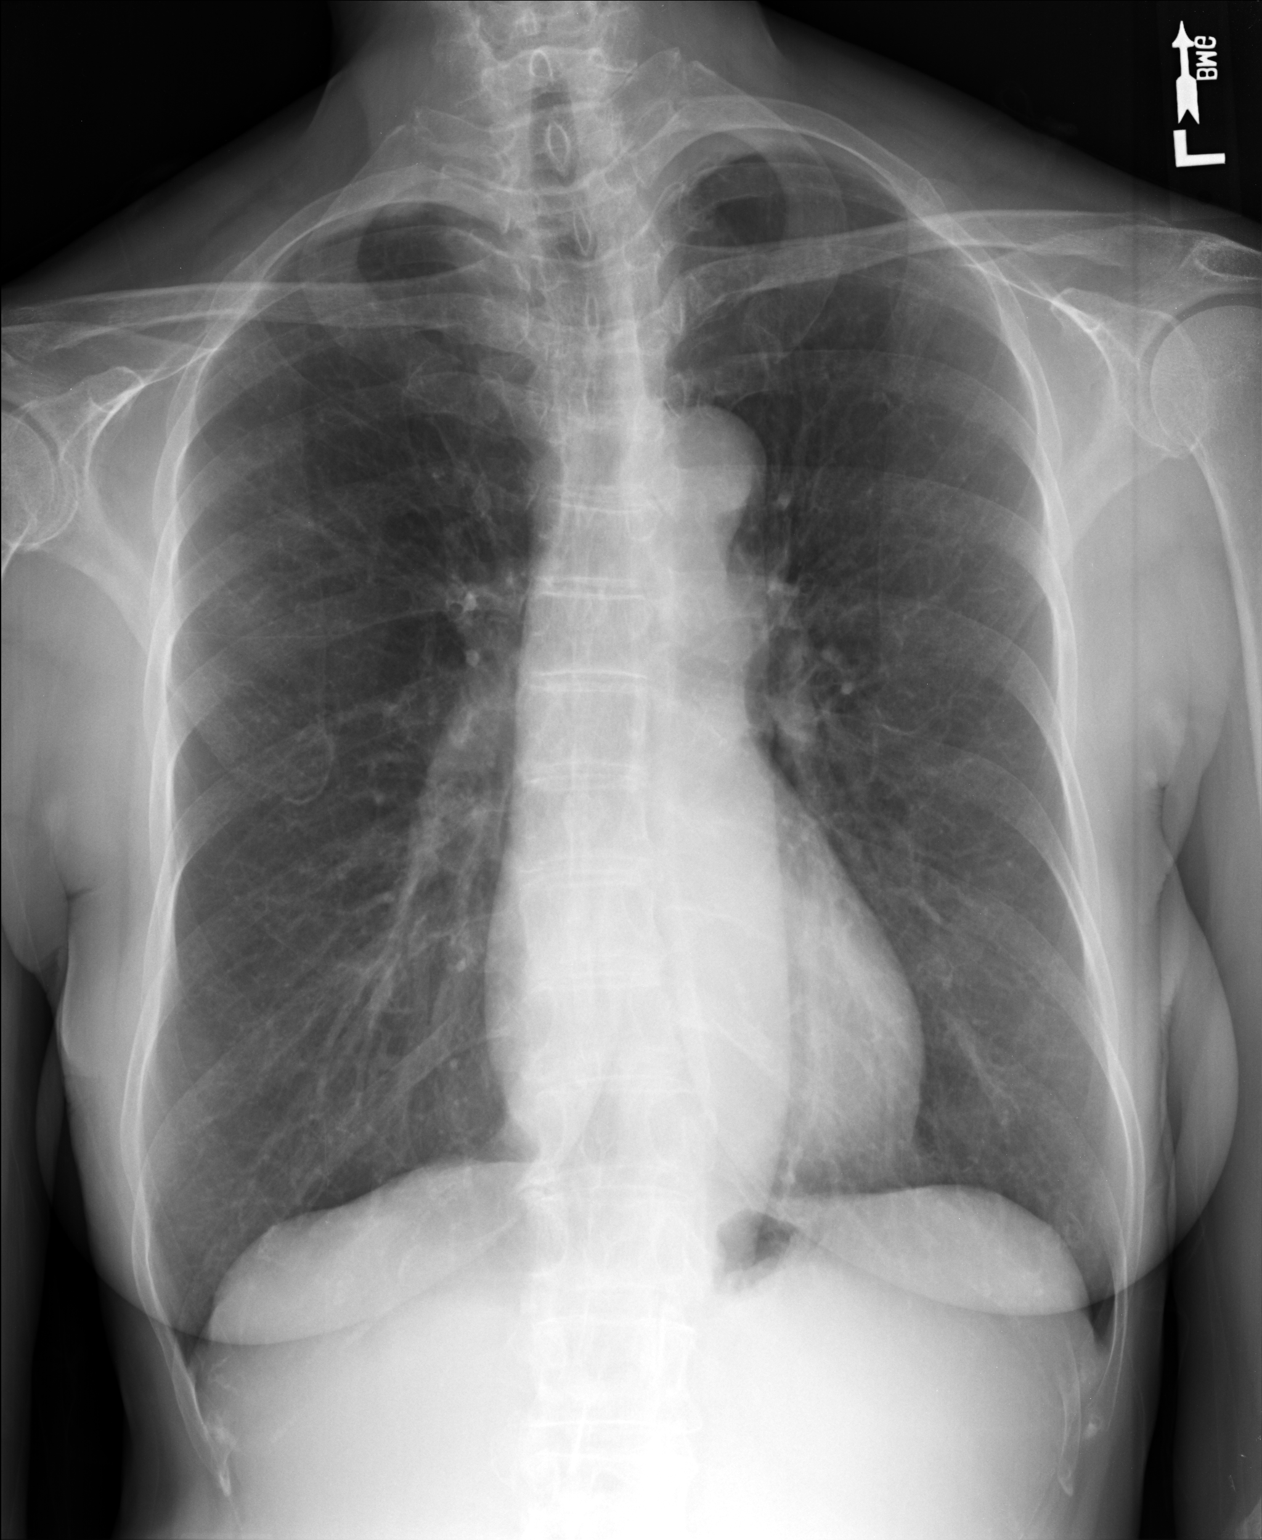

[lateral]
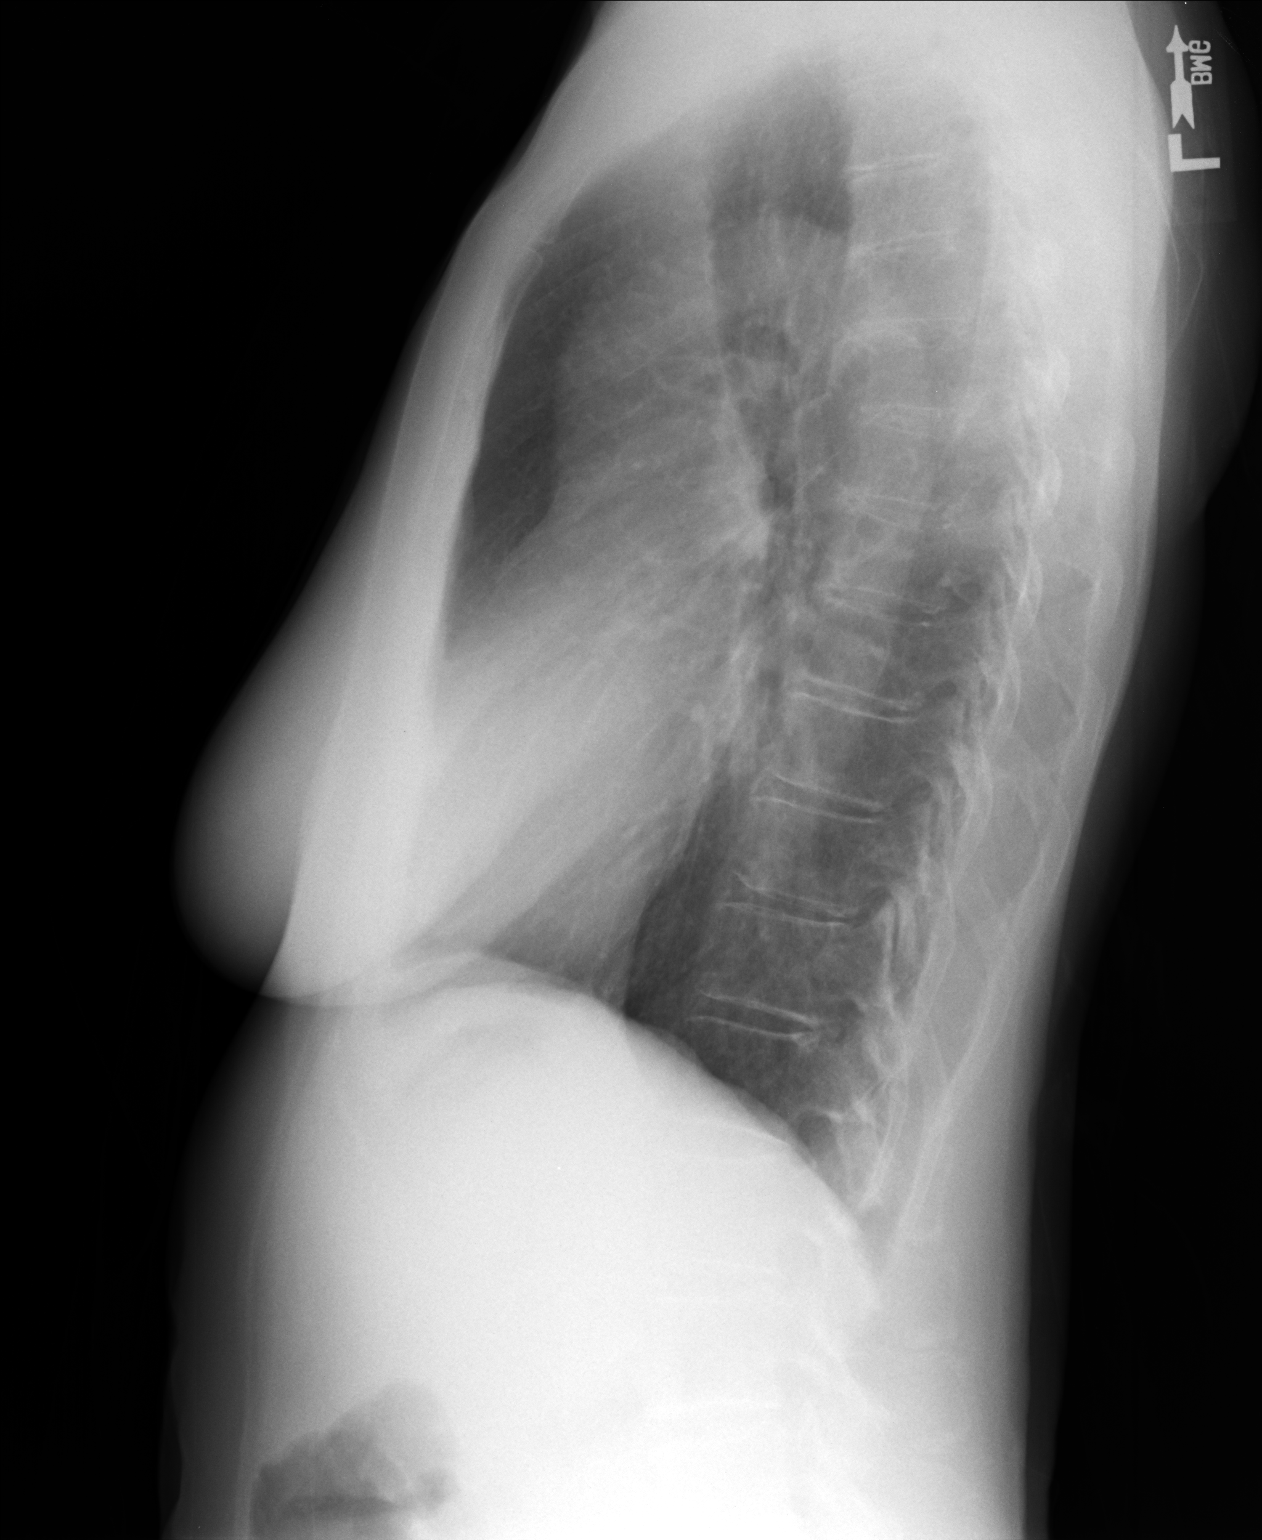

[2 of 2 positions shown; findings below may reference images not displayed]

FINDINGS: There is no edema or consolidation. Heart size and pulmonary
vascularity are normal. On the lateral view, there is prominence of
the ascending thoracic aorta. On the lateral view, the
anterior-posterior dimension of the proximal at ascending aorta is
approximately 5 cm. There is no adenopathy. There is upper thoracic
levoscoliosis.
IMPRESSION: No edema or consolidation. Prominence of the proximal ascending
thoracic aorta on the lateral view. This finding may well warrant
contrast enhanced chest CT to further evaluate the ascending
thoracic aorta.

## 2017-04-13 ENCOUNTER — Other Ambulatory Visit: Payer: Self-pay | Admitting: Internal Medicine

## 2017-06-12 DIAGNOSIS — H2513 Age-related nuclear cataract, bilateral: Secondary | ICD-10-CM | POA: Diagnosis not present

## 2017-07-19 ENCOUNTER — Telehealth: Payer: Self-pay | Admitting: Internal Medicine

## 2017-07-19 ENCOUNTER — Other Ambulatory Visit: Payer: Self-pay

## 2017-07-19 ENCOUNTER — Other Ambulatory Visit (INDEPENDENT_AMBULATORY_CARE_PROVIDER_SITE_OTHER): Payer: Medicare Other

## 2017-07-19 ENCOUNTER — Encounter: Payer: Self-pay | Admitting: Internal Medicine

## 2017-07-19 ENCOUNTER — Ambulatory Visit (INDEPENDENT_AMBULATORY_CARE_PROVIDER_SITE_OTHER): Payer: Medicare Other | Admitting: Internal Medicine

## 2017-07-19 VITALS — BP 116/84 | HR 54 | Temp 98.3°F | Ht 65.75 in | Wt 110.0 lb

## 2017-07-19 DIAGNOSIS — C186 Malignant neoplasm of descending colon: Secondary | ICD-10-CM

## 2017-07-19 DIAGNOSIS — K852 Alcohol induced acute pancreatitis without necrosis or infection: Secondary | ICD-10-CM | POA: Diagnosis not present

## 2017-07-19 DIAGNOSIS — E559 Vitamin D deficiency, unspecified: Secondary | ICD-10-CM

## 2017-07-19 DIAGNOSIS — Z85038 Personal history of other malignant neoplasm of large intestine: Secondary | ICD-10-CM | POA: Diagnosis not present

## 2017-07-19 DIAGNOSIS — R202 Paresthesia of skin: Secondary | ICD-10-CM | POA: Diagnosis not present

## 2017-07-19 DIAGNOSIS — I10 Essential (primary) hypertension: Secondary | ICD-10-CM

## 2017-07-19 DIAGNOSIS — F1029 Alcohol dependence with unspecified alcohol-induced disorder: Secondary | ICD-10-CM | POA: Diagnosis not present

## 2017-07-19 DIAGNOSIS — E785 Hyperlipidemia, unspecified: Secondary | ICD-10-CM

## 2017-07-19 LAB — CBC WITH DIFFERENTIAL/PLATELET
BASOS ABS: 0.1 10*3/uL (ref 0.0–0.1)
Basophils Relative: 1.5 % (ref 0.0–3.0)
EOS ABS: 0.2 10*3/uL (ref 0.0–0.7)
Eosinophils Relative: 3.8 % (ref 0.0–5.0)
HEMATOCRIT: 46.5 % — AB (ref 36.0–46.0)
Hemoglobin: 15.7 g/dL — ABNORMAL HIGH (ref 12.0–15.0)
LYMPHS PCT: 56.2 % — AB (ref 12.0–46.0)
Lymphs Abs: 2.4 10*3/uL (ref 0.7–4.0)
MCHC: 33.7 g/dL (ref 30.0–36.0)
MCV: 95.3 fl (ref 78.0–100.0)
MONOS PCT: 16.3 % — AB (ref 3.0–12.0)
Monocytes Absolute: 0.7 10*3/uL (ref 0.1–1.0)
NEUTROS PCT: 22.2 % — AB (ref 43.0–77.0)
Neutro Abs: 0.9 10*3/uL — ABNORMAL LOW (ref 1.4–7.7)
Platelets: 243 10*3/uL (ref 150.0–400.0)
RBC: 4.88 Mil/uL (ref 3.87–5.11)
RDW: 13.8 % (ref 11.5–15.5)
WBC: 4.2 10*3/uL (ref 4.0–10.5)

## 2017-07-19 LAB — BASIC METABOLIC PANEL
BUN: 10 mg/dL (ref 6–23)
CALCIUM: 10.2 mg/dL (ref 8.4–10.5)
CHLORIDE: 98 meq/L (ref 96–112)
CO2: 32 meq/L (ref 19–32)
Creatinine, Ser: 0.77 mg/dL (ref 0.40–1.20)
GFR: 94.44 mL/min (ref >60.00)
GLUCOSE: 89 mg/dL (ref 70–99)
Potassium: 3.8 mEq/L (ref 3.5–5.1)
SODIUM: 140 meq/L (ref 135–145)

## 2017-07-19 LAB — HEPATIC FUNCTION PANEL
ALBUMIN: 4.3 g/dL (ref 3.5–5.2)
ALT: 62 U/L — AB (ref 0–35)
AST: 86 U/L — ABNORMAL HIGH (ref 0–37)
Alkaline Phosphatase: 50 U/L (ref 39–117)
Bilirubin, Direct: 0.2 mg/dL (ref 0.0–0.3)
TOTAL PROTEIN: 7.8 g/dL (ref 6.0–8.3)
Total Bilirubin: 0.4 mg/dL (ref 0.2–1.2)

## 2017-07-19 LAB — TSH: TSH: 0.77 u[IU]/mL (ref 0.35–4.50)

## 2017-07-19 LAB — VITAMIN B12: VITAMIN B 12: 1175 pg/mL — AB (ref 211–911)

## 2017-07-19 LAB — VITAMIN D 25 HYDROXY (VIT D DEFICIENCY, FRACTURES): VITD: 170.43 ng/mL — AB (ref 30.00–100.00)

## 2017-07-19 MED ORDER — ATENOLOL-CHLORTHALIDONE 50-25 MG PO TABS: 1.0000 | ORAL_TABLET | Freq: Every day | ORAL | 3 refills | Status: DC

## 2017-07-19 NOTE — Telephone Encounter (Signed)
Vit D lab came back still elevated at 170.4.  Spoke with Dr. Ronnald Ramp and having pt stop Vit D and recheck lab in 2 weeks.

## 2017-07-19 NOTE — Assessment & Plan Note (Signed)
No relapse 

## 2017-07-19 NOTE — Assessment & Plan Note (Signed)
Dr Henrene Pastor CEA

## 2017-07-19 NOTE — Progress Notes (Signed)
Subjective:  Patient ID: Diana Barker, female    DOB: [birth year]  Age: 72 y.o. MRN: [id number]  CC: No chief complaint on file.   HPI Diana Barker presents for HTN, B12 and vit D def   Outpatient Medications Prior to Visit  Medication Sig Dispense Refill  . aspirin 81 MG tablet Take 81 mg by mouth daily.      . Cholecalciferol (VITAMIN D3) 1000 UNITS tablet Take 2,000 Units by mouth daily.      . Cyanocobalamin (VITAMIN B-12) 1000 MCG SUBL Place 1 tablet (1,000 mcg total) under the tongue daily. 100 tablet 3  . omeprazole (PRILOSEC) 20 MG capsule 2 (two) times daily.     Marland Kitchen atenolol-chlorthalidone (TENORETIC) 50-25 MG tablet Take 1 tablet by mouth daily. Patient needs office visit before refills will be given 90 tablet 0   No facility-administered medications prior to visit.     ROS Review of Systems  Constitutional: Negative for activity change, appetite change, chills, fatigue and unexpected weight change.  HENT: Negative for congestion, mouth sores and sinus pressure.   Eyes: Negative for visual disturbance.  Respiratory: Negative for cough and chest tightness.   Gastrointestinal: Negative for abdominal pain and nausea.  Genitourinary: Negative for difficulty urinating, frequency and vaginal pain.  Musculoskeletal: Negative for back pain and gait problem.  Skin: Negative for pallor and rash.  Neurological: Negative for dizziness, tremors, weakness, numbness and headaches.  Psychiatric/Behavioral: Negative for confusion and sleep disturbance.    Objective:  BP 116/84 (BP Location: Left Arm, Patient Position: Sitting, Cuff Size: Normal)   Pulse (!) 54   Temp 98.3 F (36.8 C) (Oral)   Ht 5' 5.75" (1.67 m)   Wt 110 lb (49.9 kg)   SpO2 99%   BMI 17.89 kg/m   BP Readings from Last 3 Encounters:  07/19/17 116/84  02/02/16 126/90  10/19/14 130/90    Wt Readings from Last 3 Encounters:  07/19/17 110 lb (49.9 kg)  02/02/16 108 lb (49 kg)  10/19/14 107 lb  (48.5 kg)    Physical Exam  Constitutional: She appears well-developed. No distress.  HENT:  Head: Normocephalic.  Right Ear: External ear normal.  Left Ear: External ear normal.  Nose: Nose normal.  Mouth/Throat: Oropharynx is clear and moist.  Eyes: Pupils are equal, round, and reactive to light. Conjunctivae are normal. Right eye exhibits no discharge. Left eye exhibits no discharge.  Neck: Normal range of motion. Neck supple. No JVD present. No tracheal deviation present. No thyromegaly present.  Cardiovascular: Normal rate, regular rhythm and normal heart sounds.  Pulmonary/Chest: No stridor. No respiratory distress. She has no wheezes.  Abdominal: Soft. Bowel sounds are normal. She exhibits no distension and no mass. There is no tenderness. There is no rebound and no guarding.  Musculoskeletal: She exhibits no edema or tenderness.  Lymphadenopathy:    She has no cervical adenopathy.  Neurological: She displays normal reflexes. No cranial nerve deficit. She exhibits normal muscle tone. Coordination normal.  Skin: No rash noted. No erythema.  Psychiatric: She has a normal mood and affect. Her behavior is normal. Judgment and thought content normal.    Lab Results  Component Value Date   WBC 4.7 10/19/2014   HGB 15.9 (H) 10/19/2014   HCT 48.6 (H) 10/19/2014   PLT 315.0 10/19/2014   GLUCOSE 103 (H) 10/19/2014   CHOL 215 (H) 10/19/2014   TRIG 143.0 10/19/2014   HDL 84.70 10/19/2014   LDLDIRECT 101.6 12/18/2008  LDLCALC 102 (H) 10/19/2014   ALT 37 (H) 10/19/2014   AST 49 (H) 10/19/2014   NA 136 10/19/2014   K 4.8 10/19/2014   CL 97 10/19/2014   CREATININE 0.65 10/19/2014   BUN 11 10/19/2014   CO2 32 10/19/2014   TSH 0.71 10/19/2014   INR 0.9 ratio 12/18/2008    US Soft Tissue Head/neck  Result Date: 09/15/2014 CLINICAL DATA:  72 year old female with left thyroid nodule seen on CT scan of the chest EXAM: THYROID ULTRASOUND TECHNIQUE: Ultrasound examination of the  thyroid gland and adjacent soft tissues was performed. COMPARISON:  CT scan of the chest 09/09/2014 FINDINGS: Right thyroid lobe Measurements: 4.8 x 1.4 x 1.9 cm. Hypoechoic solid 14 x 10 x 7 mm nodule in the deep aspect of the lower gland. A second, smaller 6 mm hypoechoic nodule is noted superficially within the upper gland. Additional tiny hypoechoic cysts versus nodules are scattered. Left thyroid lobe Measurements: 4.0 x 1.7 x 1.9 cm. Anechoic cystic lesion in the left mid gland measures 9 x 6 x 10 mm in corresponds with the hypoechoic lesion seen on the recent CT scan. There is an adjacent 6 mm solid hypoechoic nodule slightly superiorly and medially. Isthmus Thickness: 0.3 cm.  No nodules visualized. Lymphadenopathy None visualized. IMPRESSION: Multinodular thyroid gland containing a combination of solid hypoechoic nodules and thyroid cysts. The largest individual nodule measures up to 14 mm in the right lower pole. Findings do not meet current SRU consensus criteria for biopsy. Follow-up by clinical exam is recommended. If patient has known risk factors for thyroid carcinoma, consider follow-up ultrasound in 12 months. If patient is clinically hyperthyroid, consider nuclear medicine thyroid uptake and scan. Reference: Management of Thyroid Nodules Detected at [place]: Society of Radiologists in [place]. Radiology 2005; [id number]. Electronically Signed   By: Jacqulynn Cadet M.D.   On: 09/15/2014 12:38    Assessment & Plan:   There are no diagnoses linked to this encounter. I am having Mardene Celeste A. Grisso maintain her aspirin, cholecalciferol, Vitamin B-12, omeprazole, and atenolol-chlorthalidone.  Meds ordered this encounter  Medications  . atenolol-chlorthalidone (TENORETIC) 50-25 MG tablet    Sig: Take 1 tablet by mouth daily. Patient needs office visit before refills will be given    Dispense:  90 tablet    Refill:  3     Follow-up: No follow-ups on  file.  Walker Kehr, MD

## 2017-07-19 NOTE — Telephone Encounter (Signed)
Dr. Alain Marion notified of Vit D results, requested [facility] lab to rerun test for verification.

## 2017-07-19 NOTE — Telephone Encounter (Signed)
CRITICAL VALUE STICKER  CRITICAL VALUE:170.43 Vitamin D  RECEIVER (on-site recipient of call):Julie  DATE & TIME NOTIFIED: 3/21 at 12:12  MESSENGER (representative from lab):Lavella Lemons  MD NOTIFIED: yes  TIME OF NOTIFICATION:12:15  RESPONSE:  Will follow up with patient

## 2017-07-19 NOTE — Assessment & Plan Note (Signed)
3 cans beer/d - discussed

## 2017-07-19 NOTE — Assessment & Plan Note (Signed)
Atenolol HCT 

## 2017-07-19 NOTE — Assessment & Plan Note (Signed)
CEA

## 2017-07-20 LAB — CEA: CEA: 1.8 ng/mL

## 2017-12-14 ENCOUNTER — Telehealth: Payer: Self-pay | Admitting: Internal Medicine

## 2017-12-14 NOTE — Telephone Encounter (Signed)
Called [facility] who states that the refill for requested medication is currently being processed.

## 2017-12-14 NOTE — Telephone Encounter (Signed)
Copied from [place] [phone]. Topic: Quick Communication - Rx Refill/Question >> Dec 14, 2017 11:39 AM Bea Graff, NT wrote: Medication: atenolol-chlorthalidone (TENORETIC) 50-25 MG tablet  Has the patient contacted their pharmacy? Yes.   (Agent: If no, request that the patient contact the pharmacy for the refill.) (Agent: If yes, when and what did the pharmacy advise?)  Preferred Pharmacy (with phone number or street name): [id number] - [place], [place] [phone] (Phone) [phone] (Fax)    Agent: Please be advised that RX refills may take up to 3 business days. We ask that you follow-up with your pharmacy.

## 2018-01-17 ENCOUNTER — Ambulatory Visit: Payer: Self-pay | Admitting: Internal Medicine

## 2018-02-05 ENCOUNTER — Ambulatory Visit (INDEPENDENT_AMBULATORY_CARE_PROVIDER_SITE_OTHER)
Admission: RE | Admit: 2018-02-05 | Discharge: 2018-02-05 | Disposition: A | Discharge status: Home / Self Care | Payer: Medicare Other | Source: Ambulatory Visit | Attending: Internal Medicine | Admitting: Internal Medicine

## 2018-02-05 ENCOUNTER — Encounter: Payer: Self-pay | Admitting: Internal Medicine

## 2018-02-05 ENCOUNTER — Ambulatory Visit (INDEPENDENT_AMBULATORY_CARE_PROVIDER_SITE_OTHER): Payer: Medicare Other | Admitting: Internal Medicine

## 2018-02-05 VITALS — BP 122/84 | HR 50 | Temp 97.9°F | Ht 65.75 in | Wt 105.0 lb

## 2018-02-05 DIAGNOSIS — K219 Gastro-esophageal reflux disease without esophagitis: Secondary | ICD-10-CM | POA: Diagnosis not present

## 2018-02-05 DIAGNOSIS — R0602 Shortness of breath: Secondary | ICD-10-CM | POA: Diagnosis not present

## 2018-02-05 DIAGNOSIS — R0789 Other chest pain: Secondary | ICD-10-CM

## 2018-02-05 DIAGNOSIS — R079 Chest pain, unspecified: Secondary | ICD-10-CM

## 2018-02-05 DIAGNOSIS — I10 Essential (primary) hypertension: Secondary | ICD-10-CM

## 2018-02-05 MED ORDER — TROPAZONE EX LOTN: 1.0000 "application " | TOPICAL_LOTION | Freq: Two times a day (BID) | CUTANEOUS | 11 refills | Status: DC

## 2018-02-05 MED ORDER — OMEPRAZOLE 40 MG PO CPDR: 40.0000 mg | DELAYED_RELEASE_CAPSULE | Freq: Every day | ORAL | 11 refills | Status: DC

## 2018-02-05 NOTE — Assessment & Plan Note (Addendum)
CXR EKG Labs Card referral  - pt refused To ER ir CP re-occurred  Reduce smoking Treat GERD

## 2018-02-05 NOTE — Assessment & Plan Note (Signed)
Possible alcoholic gastritis Re-start PPI

## 2018-02-05 NOTE — Assessment & Plan Note (Signed)
Tenoretic

## 2018-02-05 NOTE — Patient Instructions (Addendum)
Go to ER if worse 

## 2018-02-05 NOTE — Progress Notes (Signed)
Subjective:  Patient ID: Diana Barker, female    DOB: Sep 17, 1945  Age: 72 y.o. MRN: [id number]  CC: No chief complaint on file.   HPI LORRIN BODNER presents for chest pressure episodes off and on - would start in am and last for 1/2 a day, weakness, anxiety w/it. It started weeks ago.Marland KitchenMarland KitchenLast one - yesterday..Noted. Thank you! Taking Prilosec  Outpatient Medications Prior to Visit  Medication Sig Dispense Refill  . aspirin 81 MG tablet Take 81 mg by mouth daily.      Marland Kitchen atenolol-chlorthalidone (TENORETIC) 50-25 MG tablet Take 1 tablet by mouth daily. Patient needs office visit before refills will be given 90 tablet 3  . Cyanocobalamin (VITAMIN B-12) 1000 MCG SUBL Place 1 tablet (1,000 mcg total) under the tongue daily. 100 tablet 3  . omeprazole (PRILOSEC) 20 MG capsule 2 (two) times daily.      No facility-administered medications prior to visit.     ROS: Review of Systems  Constitutional: Negative for activity change, appetite change, chills, fatigue and unexpected weight change.  HENT: Negative for congestion, mouth sores and sinus pressure.   Eyes: Negative for visual disturbance.  Respiratory: Positive for chest tightness. Negative for cough.   Cardiovascular: Positive for chest pain.  Gastrointestinal: Negative for abdominal pain and nausea.  Genitourinary: Negative for difficulty urinating, frequency and vaginal pain.  Musculoskeletal: Negative for back pain and gait problem.  Skin: Negative for pallor and rash.  Neurological: Negative for dizziness, tremors, weakness, numbness and headaches.  Psychiatric/Behavioral: Negative for confusion, sleep disturbance and suicidal ideas. The patient is nervous/anxious.     Objective:  BP 122/84 (BP Location: Left Arm, Patient Position: Sitting, Cuff Size: Normal)   Pulse (!) 50   Temp 97.9 F (36.6 C) (Oral)   Ht 5' 5.75" (1.67 m)   Wt 105 lb (47.6 kg)   SpO2 98%   BMI 17.08 kg/m   BP Readings from Last 3 Encounters:    02/05/18 122/84  07/19/17 116/84  02/02/16 126/90    Wt Readings from Last 3 Encounters:  02/05/18 105 lb (47.6 kg)  07/19/17 110 lb (49.9 kg)  02/02/16 108 lb (49 kg)    Physical Exam  Constitutional: She appears well-developed. No distress.  HENT:  Head: Normocephalic.  Right Ear: External ear normal.  Left Ear: External ear normal.  Nose: Nose normal.  Mouth/Throat: Oropharynx is clear and moist.  Eyes: Pupils are equal, round, and reactive to light. Conjunctivae are normal. Right eye exhibits no discharge. Left eye exhibits no discharge.  Neck: Normal range of motion. Neck supple. No JVD present. No tracheal deviation present. No thyromegaly present.  Cardiovascular: Normal rate, regular rhythm and normal heart sounds.  Pulmonary/Chest: No stridor. No respiratory distress. She has no wheezes.  Abdominal: Soft. Bowel sounds are normal. She exhibits no distension and no mass. There is no tenderness. There is no rebound and no guarding.  Musculoskeletal: She exhibits no edema or tenderness.  Lymphadenopathy:    She has no cervical adenopathy.  Neurological: She displays normal reflexes. No cranial nerve deficit. She exhibits normal muscle tone. Coordination normal.  Skin: No rash noted. No erythema.  Psychiatric: She has a normal mood and affect. Her behavior is normal. Judgment and thought content normal.  Thin   Procedure: EKG Indication: chest pain Impression: NSR. No acute changes.  Lab Results  Component Value Date   WBC 4.2 07/19/2017   HGB 15.7 (H) 07/19/2017   HCT 46.5 (H) 07/19/2017  PLT 243.0 07/19/2017   GLUCOSE 89 07/19/2017   CHOL 215 (H) 10/19/2014   TRIG 143.0 10/19/2014   HDL 84.70 10/19/2014   LDLDIRECT 101.6 12/18/2008   LDLCALC 102 (H) 10/19/2014   ALT 62 (H) 07/19/2017   AST 86 (H) 07/19/2017   NA 140 07/19/2017   K 3.8 07/19/2017   CL 98 07/19/2017   CREATININE 0.77 07/19/2017   BUN 10 07/19/2017   CO2 32 07/19/2017   TSH 0.77  07/19/2017   INR 0.9 ratio 12/18/2008    US Soft Tissue Head/neck  Result Date: 09/15/2014 CLINICAL DATA:  72 year old female with left thyroid nodule seen on CT scan of the chest EXAM: THYROID ULTRASOUND TECHNIQUE: Ultrasound examination of the thyroid gland and adjacent soft tissues was performed. COMPARISON:  CT scan of the chest 09/09/2014 FINDINGS: Right thyroid lobe Measurements: 4.8 x 1.4 x 1.9 cm. Hypoechoic solid 14 x 10 x 7 mm nodule in the deep aspect of the lower gland. A second, smaller 6 mm hypoechoic nodule is noted superficially within the upper gland. Additional tiny hypoechoic cysts versus nodules are scattered. Left thyroid lobe Measurements: 4.0 x 1.7 x 1.9 cm. Anechoic cystic lesion in the left mid gland measures 9 x 6 x 10 mm in corresponds with the hypoechoic lesion seen on the recent CT scan. There is an adjacent 6 mm solid hypoechoic nodule slightly superiorly and medially. Isthmus Thickness: 0.3 cm.  No nodules visualized. Lymphadenopathy None visualized. IMPRESSION: Multinodular thyroid gland containing a combination of solid hypoechoic nodules and thyroid cysts. The largest individual nodule measures up to 14 mm in the right lower pole. Findings do not meet current SRU consensus criteria for biopsy. Follow-up by clinical exam is recommended. If patient has known risk factors for thyroid carcinoma, consider follow-up ultrasound in 12 months. If patient is clinically hyperthyroid, consider nuclear medicine thyroid uptake and scan. Reference: Management of Thyroid Nodules Detected at [place]: Society of Radiologists in [place]. Radiology 2005; [id number]. Electronically Signed   By: Jacqulynn Cadet M.D.   On: 09/15/2014 12:38    Assessment & Plan:   There are no diagnoses linked to this encounter.   No orders of the defined types were placed in this encounter.    Follow-up: No follow-ups on file.  Walker Kehr, MD

## 2018-02-05 NOTE — Addendum Note (Signed)
Addended by: Karren Cobble on: 02/05/2018 01:55 PM   Modules accepted: Orders

## 2018-07-26 ENCOUNTER — Other Ambulatory Visit: Payer: Self-pay | Admitting: Internal Medicine

## 2018-07-30 ENCOUNTER — Telehealth: Payer: Self-pay | Admitting: Internal Medicine

## 2018-07-30 NOTE — Telephone Encounter (Signed)
Pt left voicemail message requesting a refill of Atenolol.Returned call to pt to verify DOB and current address. Current phone number is different from what was listed previously in chart. Pt states she picked up the prescription for Atenolol today. No other concerns voiced at this time. Contact information updated in chart.

## 2018-10-14 ENCOUNTER — Encounter: Payer: Self-pay | Admitting: Internal Medicine

## 2019-06-02 ENCOUNTER — Ambulatory Visit: Payer: Self-pay

## 2019-06-07 ENCOUNTER — Ambulatory Visit: Payer: Medicare HMO | Attending: Internal Medicine

## 2019-06-07 DIAGNOSIS — Z23 Encounter for immunization: Secondary | ICD-10-CM | POA: Insufficient documentation

## 2019-06-07 NOTE — Progress Notes (Signed)
   Covid-19 Vaccination Clinic  Name:  Diana Barker    MRN: [id number] DOB: 09/11/1945  06/07/2019  Diana Barker was observed post Covid-19 immunization for 15 minutes without incidence. She was provided with Vaccine Information Sheet and instruction to access the V-Safe system.   Diana Barker was instructed to call 911 with any severe reactions post vaccine: Marland Kitchen Difficulty breathing  . Swelling of your face and throat  . A fast heartbeat  . A bad rash all over your body  . Dizziness and weakness    Immunizations Administered    Name Date Dose VIS Date Route   Pfizer COVID-19 Vaccine 06/07/2019  9:37 AM 0.3 mL 04/11/2019 Intramuscular   Manufacturer: [place]   Lot: [id number]   [place]: [id number]

## 2019-07-02 ENCOUNTER — Ambulatory Visit: Payer: Medicare HMO | Attending: Internal Medicine

## 2019-07-02 DIAGNOSIS — Z23 Encounter for immunization: Secondary | ICD-10-CM | POA: Insufficient documentation

## 2019-07-02 NOTE — Progress Notes (Signed)
   Covid-19 Vaccination Clinic  Name:  Diana Barker    MRN: [id number] DOB: 20-May-1945  07/02/2019  [name] was observed post Covid-19 immunization for 15 minutes without incident. She was provided with Vaccine Information Sheet and instruction to access the V-Safe system.   [name] was instructed to call 911 with any severe reactions post vaccine: Marland Kitchen Difficulty breathing  . Swelling of face and throat  . A fast heartbeat  . A bad rash all over body  . Dizziness and weakness   Immunizations Administered    Name Date Dose VIS Date Route   Pfizer COVID-19 Vaccine 07/02/2019  8:58 AM 0.3 mL 04/11/2019 Intramuscular   Manufacturer: [place]   Lot: [id number]   [place]: [id number]

## 2019-07-16 ENCOUNTER — Ambulatory Visit (HOSPITAL_COMMUNITY)
Admission: EM | Admit: 2019-07-16 | Discharge: 2019-07-16 | Disposition: A | Discharge status: Home / Self Care | Payer: Medicare HMO | Attending: Family Medicine | Admitting: Family Medicine

## 2019-07-16 ENCOUNTER — Other Ambulatory Visit: Payer: Self-pay

## 2019-07-16 ENCOUNTER — Encounter (HOSPITAL_COMMUNITY): Payer: Self-pay

## 2019-07-16 DIAGNOSIS — K047 Periapical abscess without sinus: Secondary | ICD-10-CM | POA: Diagnosis not present

## 2019-07-16 MED ORDER — HYDROCODONE-ACETAMINOPHEN 5-325 MG PO TABS
ORAL_TABLET | ORAL | Status: AC
  Filled 2019-07-16: qty 1

## 2019-07-16 MED ORDER — HYDROCODONE-ACETAMINOPHEN 5-325 MG PO TABS
1.0000 | ORAL_TABLET | Freq: Once | ORAL | Status: AC
  Administered 2019-07-16: 1 via ORAL

## 2019-07-16 MED ORDER — PENICILLIN V POTASSIUM 500 MG PO TABS: 500.0000 mg | ORAL_TABLET | Freq: Four times a day (QID) | ORAL | 0 refills | Status: AC

## 2019-07-16 MED ORDER — TRAMADOL HCL 50 MG PO TABS: 50.0000 mg | ORAL_TABLET | Freq: Three times a day (TID) | ORAL | 0 refills | Status: DC | PRN

## 2019-07-16 NOTE — Discharge Instructions (Signed)
Complete course of antibiotics.  May continue with tylenol regularly or ibuprofen for breakthrough pain.  Tramadol for breakthrough pain, don't take a dose for another 6 hours following the medication we have given you today.  Please follow up with a dentist for definitive treatment.

## 2019-07-16 NOTE — ED Provider Notes (Signed)
[place]    CSN: [id number] Arrival date & time: 07/16/19  1053      History   Chief Complaint Chief Complaint  Patient presents with  . Dental Pain    HPI Diana Barker is a 74 y.o. female.   Diana Barker presents with complaints of dental pain which started 3 days ago to left lower jaw line. States there are multiple teeth she "needs out."  Minimal facial swelling. Unable to eat due to pain. Has tried taking advil, last last night, which didn't help. No poor taste or obvious drainage in the mouth. Denies any previous similar. Doesn't follow regularly with a dentist.     ROS per HPI, negative if not otherwise mentioned.      Past Medical History:  Diagnosis Date  . Alcoholism ([place])   . Colon cancer [facility]) 2010   Dr. Marin Olp  . Colon polyp 10/15/2013   Tubular adenoma  . Esophageal stricture   . HTN (hypertension)   . Liver cyst   . Pancreas cyst 2010    Patient Active Problem List   Diagnosis Date Noted  . Chest pain, atypical 02/05/2018  . GERD (gastroesophageal reflux disease) 02/05/2018  . Neoplasm of uncertain behavior of skin 02/02/2016  . Sebaceous cyst 02/02/2016  . Hand pain, left 02/02/2016  . Dysphagia, pharyngoesophageal phase 10/19/2014  . Well adult exam 10/19/2014  . Cough 06/23/2014  . Chest wall hematoma 03/12/2013  . Herpes zoster 03/12/2013  . Chest wall contusion 12/06/2012  . Elbow pain 12/06/2012  . Left leg pain 06/19/2011  . LBP (low back pain) 06/19/2011  . Hypokalemia 12/25/2010  . Alcohol-induced pancreatitis 12/16/2010  . Cystic disease of liver 12/16/2010  . Vertigo 10/13/2010  . History of malignant neoplasm of large intestine 02/23/2009  . Colon cancer ([place]) 12/18/2008  . Alcohol dependence ([place]) 12/18/2008  . ABDOMINAL PAIN 12/18/2008  . Tobacco use disorder 02/24/2008  . Essential hypertension 12/08/2006    Past Surgical History:  Procedure Laterality Date  . COLECTOMY  2010   hemi/Dr Evelena Peat  .  DENTAL SURGERY  2015   extractions  . PORTACATH PLACEMENT  2010  . portacath removal  2011    OB History   No obstetric history on file.      Home Medications    Prior to Admission medications   Medication Sig Start Date End Date Taking? Authorizing Provider  calcium-vitamin D (OSCAL WITH D) 500-200 MG-UNIT tablet Take 1 tablet by mouth.   Yes [provider]  aspirin 81 MG tablet Take 81 mg by mouth daily.      [provider]  atenolol-chlorthalidone (TENORETIC) 50-25 MG tablet TAKE 1 TABLET BY MOUTH DAILY. PATIENT NEEDS OFFICE VISIT BEFORE REFILLS WILL BE GIVEN 07/28/18   Plotnikov, Evie Lacks, MD  Cyanocobalamin (VITAMIN B-12) 1000 MCG SUBL Place 1 tablet (1,000 mcg total) under the tongue daily. 11/24/12   Plotnikov, Evie Lacks, MD  omeprazole (PRILOSEC) 40 MG capsule Take 1 capsule (40 mg total) by mouth daily. qam 02/05/18   Plotnikov, Evie Lacks, MD  penicillin v potassium (VEETID) 500 MG tablet Take 1 tablet (500 mg total) by mouth 4 (four) times daily for 7 days. 07/16/19 07/23/19  Zigmund Gottron, NP  traMADol (ULTRAM) 50 MG tablet Take 1 tablet (50 mg total) by mouth every 8 (eight) hours as needed. 07/16/19   Zigmund Gottron, NP    Family History Family History  Adopted: Yes  Problem Relation Age of  Onset  . Hypertension Other   . Colon cancer Neg Hx     Social History Social History   Tobacco Use  . Smoking status: Current Every Day Smoker    Packs/day: 1.00    Types: Cigarettes  . Smokeless tobacco: Never Used  Substance Use Topics  . Alcohol use: Yes    Alcohol/week: 28.0 standard drinks    Types: 28 Cans of beer per week    Comment: 12 oz  . Drug use: No     Allergies   Patient has no known allergies.   Review of Systems Review of Systems   Physical Exam Triage Vital Signs ED Triage Vitals  Enc Vitals Group     BP 07/16/19 1119 118/90     Pulse Rate 07/16/19 1119 67     Resp 07/16/19 1119 16     Temp 07/16/19 1119 99 F  (37.2 C)     Temp Source 07/16/19 1119 Oral     SpO2 07/16/19 1119 100 %     Weight 07/16/19 1120 108 lb (49 kg)     Height 07/16/19 1120 5\' 4"  (1.626 m)     Head Circumference --      Peak Flow --      Pain Score 07/16/19 1120 10     Pain Loc --      Pain Edu? --      Excl. in [place]? --    No data found.  Updated Vital Signs BP 118/90   Pulse 67   Temp 99 F (37.2 C) (Oral)   Resp 16   Ht 5\' 4"  (1.626 m)   Wt 108 lb (49 kg)   SpO2 100%   BMI 18.54 kg/m    Physical Exam Constitutional:      General: She is not in acute distress.    Appearance: She is well-developed.  HENT:     Mouth/Throat:     Dentition: Abnormal dentition. Dental tenderness, gingival swelling and dental abscesses present.     Comments: Multiple broken teeth with swelling to gumline with small fluctuance, concerning for abscess, behind tooth #19; tenderness at jaw line underlying; no other soft tissue swelling  Cardiovascular:     Rate and Rhythm: Normal rate.  Pulmonary:     Effort: Pulmonary effort is normal.  Skin:    General: Skin is warm and dry.  Neurological:     Mental Status: She is alert and oriented to person, place, and time.      UC Treatments / Results  Labs (all labs ordered are listed, but only abnormal results are displayed) Labs Reviewed - No data to display  EKG   Radiology No results found.  Procedures Procedures (including critical care time)  Medications Ordered in UC Medications  HYDROcodone-acetaminophen (NORCO/VICODIN) 5-325 MG per tablet 1 tablet (1 tablet Oral Given 07/16/19 1153)    Initial Impression / Assessment and Plan / UC Course  I have reviewed the triage vital signs and the nursing notes.  Pertinent labs & imaging results that were available during my care of the patient were reviewed by me and considered in my medical decision making (see chart for details).     Very poor dentition with new onset pain with concern for abscess presence. Pen v  provided and pain management. Encouraged follow up with dentist for definitive treatment. Patient verbalized understanding and agreeable to plan.   Final Clinical Impressions(s) / UC Diagnoses   Final diagnoses:  Dental abscess  Discharge Instructions     Complete course of antibiotics.  May continue with tylenol regularly or ibuprofen for breakthrough pain.  Tramadol for breakthrough pain, don't take a dose for another 6 hours following the medication we have given you today.  Please follow up with a dentist for definitive treatment.    ED Prescriptions    Medication Sig Dispense Auth. Provider   penicillin v potassium (VEETID) 500 MG tablet Take 1 tablet (500 mg total) by mouth 4 (four) times daily for 7 days. 28 tablet Augusto Gamble B, NP   traMADol (ULTRAM) 50 MG tablet Take 1 tablet (50 mg total) by mouth every 8 (eight) hours as needed. 5 tablet Augusto Gamble B, NP     I have reviewed the PDMP during this encounter.   Zigmund Gottron, NP 07/16/19 1218

## 2019-07-16 NOTE — ED Triage Notes (Signed)
Pt c/o swollen gums on the left side of her mouth upper and lowerx3 days. Pt states she has not eaten in 2 days, because it's painful to chew. Pt has 1+ edema of left side of face.

## 2019-07-16 NOTE — ED Notes (Signed)
Pt stated someone was coming to pick her up and that she was not driving.

## 2019-07-23 DIAGNOSIS — Z7982 Long term (current) use of aspirin: Secondary | ICD-10-CM | POA: Diagnosis not present

## 2019-07-23 DIAGNOSIS — I10 Essential (primary) hypertension: Secondary | ICD-10-CM | POA: Diagnosis not present

## 2019-07-23 DIAGNOSIS — Z681 Body mass index (BMI) 19 or less, adult: Secondary | ICD-10-CM | POA: Diagnosis not present

## 2019-07-23 DIAGNOSIS — R636 Underweight: Secondary | ICD-10-CM | POA: Diagnosis not present

## 2019-07-23 DIAGNOSIS — Z720 Tobacco use: Secondary | ICD-10-CM | POA: Diagnosis not present

## 2019-07-23 DIAGNOSIS — Z85038 Personal history of other malignant neoplasm of large intestine: Secondary | ICD-10-CM | POA: Diagnosis not present

## 2019-08-10 ENCOUNTER — Other Ambulatory Visit: Payer: Self-pay | Admitting: Internal Medicine

## 2019-12-01 ENCOUNTER — Other Ambulatory Visit: Payer: Self-pay | Admitting: Internal Medicine

## 2019-12-04 ENCOUNTER — Other Ambulatory Visit: Payer: Self-pay

## 2019-12-04 ENCOUNTER — Ambulatory Visit (INDEPENDENT_AMBULATORY_CARE_PROVIDER_SITE_OTHER): Payer: Medicare HMO

## 2019-12-04 DIAGNOSIS — Z0000 Encounter for general adult medical examination without abnormal findings: Secondary | ICD-10-CM | POA: Diagnosis not present

## 2019-12-04 NOTE — Progress Notes (Addendum)
I connected with Diana Barker today by telephone and verified that I am speaking with the correct person using two identifiers. Location patient: home Location provider: work Persons participating in the virtual visit: Jessenia Filippone and Lisette Abu, LPN   I discussed the limitations, risks, security and privacy concerns of performing an evaluation and management service by telephone and the availability of in person appointments. I also discussed with the patient that there may be a patient responsible charge related to this service. The patient expressed understanding and verbally consented to this telephonic visit.    Interactive audio and video telecommunications were attempted between this provider and patient, however failed, due to patient having technical difficulties OR patient did not have access to video capability.  We continued and completed visit with audio only.  Some vital signs may be absent or patient reported.   Time Spent with patient on telephone encounter: 20 minutes  Subjective:   Diana Barker is a 74 y.o. female who presents for Medicare Annual (Subsequent) preventive examination.  Review of Systems    No ROS. Medicare Wellness Virtual Visit. Cardiac Risk Factors include: advanced age (>35men, >47 women);family history of premature cardiovascular disease;hypertension     Objective:    There were no vitals filed for this visit. There is no height or weight on file to calculate BMI.  Advanced Directives 12/04/2019 07/16/2019 09/29/2014 10/01/2013  Does Patient Have a Medical Advance Directive? Yes No No Patient does not have advance directive  Does patient want to make changes to medical advance directive? No - Patient declined - - -  Would patient like information on creating a medical advance directive? - No - Patient declined - -    Current Medications (verified) Outpatient Encounter Medications as of 12/04/2019  Medication Sig   aspirin 81 MG tablet  Take 81 mg by mouth daily.     atenolol-chlorthalidone (TENORETIC) 50-25 MG tablet TAKE 1 TABLET BY MOUTH DAILY. PATIENT NEEDS OFFICE VISIT BEFORE REFILLS WILL BE GIVEN   calcium-vitamin D (OSCAL WITH D) 500-200 MG-UNIT tablet Take 1 tablet by mouth.   Cyanocobalamin (VITAMIN B-12) 1000 MCG SUBL Place 1 tablet (1,000 mcg total) under the tongue daily.   omeprazole (PRILOSEC) 40 MG capsule Take 1 capsule (40 mg total) by mouth daily. qam   [DISCONTINUED] traMADol (ULTRAM) 50 MG tablet Take 1 tablet (50 mg total) by mouth every 8 (eight) hours as needed.   No facility-administered encounter medications on file as of 12/04/2019.    Allergies (verified) Patient has no known allergies.   History: Past Medical History:  Diagnosis Date   Alcoholism ([place])    Colon cancer ([place]) 2010   Dr. Marin Olp   Colon polyp 10/15/2013   Tubular adenoma   Esophageal stricture    HTN (hypertension)    Liver cyst    Pancreas cyst 2010   Past Surgical History:  Procedure Laterality Date   COLECTOMY  2010   hemi/Dr [place] SURGERY  2015   extractions   PORTACATH PLACEMENT  2010   portacath removal  2011   Family History  Adopted: Yes  Problem Relation Age of Onset   Hypertension Other    Colon cancer Neg Hx    Social History   Socioeconomic History   Marital status: Single    Spouse name: Not on file   Number of children: Not on file   Years of education: Not on file   Highest education level: Not on file  Occupational History   Occupation: Dietary Aid  Tobacco Use   Smoking status: Current Every Day Smoker    Packs/day: 0.50    Years: 50.00    Pack years: 25.00    Types: Cigarettes   Smokeless tobacco: Never Used  Substance and Sexual Activity   Alcohol use: Yes    Alcohol/week: 28.0 standard drinks    Types: 28 Cans of beer per week    Comment: 12 oz   Drug use: No   Sexual activity: Not Currently  Other Topics Concern   Not on file  Social History Narrative   Not on file    Social Determinants of Health   Financial Resource Strain: Low Risk    Difficulty of Paying Living Expenses: Not hard at all  Food Insecurity: No Food Insecurity   Worried About Charity fundraiser in the Last Year: Never true   [place] in the Last Year: Never true  Transportation Needs: No Transportation Needs   Lack of Transportation (Medical): No   Lack of Transportation (Non-Medical): No  Physical Activity: Inactive   Days of Exercise per Week: 0 days   Minutes of Exercise per Session: 0 min  Stress: No Stress Concern Present   Feeling of Stress : Not at all  Social Connections: Unknown   Frequency of Communication with Friends and Family: More than three times a week   Frequency of Social Gatherings with Friends and Family: More than three times a week   Attends Religious Services: Patient refused   Marine scientist or Organizations: Patient refused   Attends Music therapist: Patient refused   Marital Status: Patient refused    Tobacco Counseling Ready to quit: No Counseling given: No   Clinical Intake:  Pre-visit preparation completed: Yes  Pain : No/denies pain     Nutritional Risks: None Diabetes: No  How often do you need to have someone help you when you read instructions, pamphlets, or other written materials from your doctor or pharmacy?: 1 - Never What is the last grade level you completed in school?: 11th grade  Diabetic? no  Interpreter Needed?: No  Information entered by :: Lisette Abu, LPN   Activities of Daily Living In your present state of health, do you have any difficulty performing the following activities: 12/04/2019  Hearing? N  Vision? N  Difficulty concentrating or making decisions? N  Walking or climbing stairs? N  Dressing or bathing? N  Doing errands, shopping? N  Preparing Food and eating ? N  Using the Toilet? N  In the past six months, have you accidently leaked urine? N  Do you have  problems with loss of bowel control? N  Managing your Medications? N  Managing your Finances? N  Housekeeping or managing your Housekeeping? N  Some recent data might be hidden    Patient Care Team: Plotnikov, Evie Lacks, MD as PCP - Veleta Miners, MD as Consulting Physician (Orthopedic Surgery) Irene Shipper, MD as Consulting Physician (Gastroenterology) Marin Olp Rudell Cobb, MD as Consulting Physician (Oncology)  Indicate any recent Medical Services you may have received from other than [facility] providers in the past year (date may be approximate).     Assessment:   This is a routine wellness examination for [place].  Hearing/Vision screen No exam data present  Dietary issues and exercise activities discussed: Current Exercise Habits: The patient has a physically strenuous job, but has no regular exercise apart from work., Exercise limited  by: None identified  Goals       Patient Stated (pt-stated)      To stay healthy and keep working       Depression Screen PHQ 2/9 Scores 12/04/2019 07/19/2017 02/02/2016 09/09/2014 07/27/2014  PHQ - 2 Score 0 0 0 1 0    Fall Risk Fall Risk  12/04/2019 07/19/2017 02/02/2016 07/27/2014  Falls in the past year? 0 No No No  Number falls in past yr: 0 - - -  Injury with Fall? 0 - - -  Risk for fall due to : No Fall Risks - - -  Follow up Falls evaluation completed - - -    Any stairs in or around the home? No  If so, are there any without handrails? No  Home free of loose throw rugs in walkways, pet beds, electrical cords, etc? Yes  Adequate lighting in your home to reduce risk of falls? Yes   ASSISTIVE DEVICES UTILIZED TO PREVENT FALLS:  Life alert? No  Use of a cane, walker or w/c? No  Grab bars in the bathroom? No  Shower chair or bench in shower? No  Elevated toilet seat or a handicapped toilet? No   TIMED UP AND GO:  Was the test performed? No .  Length of time to ambulate 10 feet: 0 sec.   Gait steady and fast without use  of assistive device  Cognitive Function: Not indicated; patient is cogitatively intact.         Immunizations Immunization History  Administered Date(s) Administered   PFIZER SARS-COV-2 Vaccination 06/07/2019, 07/02/2019    TDAP status: Due, Education has been provided regarding the importance of this vaccine. Advised may receive this vaccine at local pharmacy or Health Dept. Aware to provide a copy of the vaccination record if obtained from local pharmacy or Health Dept. Verbalized acceptance and understanding. Flu Vaccine status: Declined, Education has been provided regarding the importance of this vaccine but patient still declined. Advised may receive this vaccine at local pharmacy or Health Dept. Aware to provide a copy of the vaccination record if obtained from local pharmacy or Health Dept. Verbalized acceptance and understanding. Pneumococcal vaccine status: Declined,  Education has been provided regarding the importance of this vaccine but patient still declined. Advised may receive this vaccine at local pharmacy or Health Dept. Aware to provide a copy of the vaccination record if obtained from local pharmacy or Health Dept. Verbalized acceptance and understanding.  Covid-19 vaccine status: Completed vaccines  Qualifies for Shingles Vaccine? No   Zostavax completed No   Shingrix Completed?: No.    Education has been provided regarding the importance of this vaccine. Patient has been advised to call insurance company to determine out of pocket expense if they have not yet received this vaccine. Advised may also receive vaccine at local pharmacy or Health Dept. Verbalized acceptance and understanding.  Screening Tests Health Maintenance  Topic Date Due   Hepatitis C Screening  Never done   TETANUS/TDAP  Never done   DEXA SCAN  Never done   PNA vac Low Risk Adult (1 of 2 - PCV13) Never done   COLONOSCOPY  10/16/2018   INFLUENZA VACCINE  11/30/2019   COVID-19 Vaccine  Completed      Health Maintenance  Health Maintenance Due  Topic Date Due   Hepatitis C Screening  Never done   TETANUS/TDAP  Never done   DEXA SCAN  Never done   PNA vac Low Risk Adult (1 of 2 -  PCV13) Never done   COLONOSCOPY  10/16/2018   INFLUENZA VACCINE  11/30/2019    Colorectal cancer screening: Completed 10/15/2013. Repeat every 5 years Mammogram status: Completed 02/22/2015. Repeat every year Bone density status: never done   Lung Cancer Screening: (Low Dose CT Chest recommended if Age 25-80 years, 30 pack-year currently smoking OR have quit w/in 15years.) does qualify.   Lung Cancer Screening Referral: no  Additional Screening:  Hepatitis C Screening: does qualify; Completed no  Vision Screening: Recommended annual ophthalmology exams for early detection of glaucoma and other disorders of the eye. Is the patient up to date with their annual eye exam?  No  Who is the provider or what is the name of the office in which the patient attends annual eye exams? Patient refused referral for eye exam If pt is not established with a provider, would they like to be referred to a provider to establish care? No .   Dental Screening: Recommended annual dental exams for proper oral hygiene (patient is requesting referral for dentist who excepts her insurance)  Community Resource Referral / Chronic Care Management: CRR required this visit?  Yes   CCM required this visit?  No      Plan:     I have personally reviewed and noted the following in the patient's chart:   Medical and social history Use of alcohol, tobacco or illicit drugs  Current medications and supplements Functional ability and status Nutritional status Physical activity Advanced directives List of other physicians Hospitalizations, surgeries, and ER visits in previous 12 months Vitals Screenings to include cognitive, depression, and falls Referrals and appointments  In addition, I have reviewed and discussed with  patient certain preventive protocols, quality metrics, and best practice recommendations. A written personalized care plan for preventive services as well as general preventive health recommendations were provided to patient.     Sheral Flow, LPN   07/04/91   Nurse Notes:  Patient is cogitatively intact. There were no vitals filed for this visit. There is no height or weight on file to calculate BMI. Patient stated that she has no issues with gait or balance; does not use any assistive devices.   Medical screening examination/treatment/procedure(s) were performed by non-physician practitioner and as supervising physician I was immediately available for consultation/collaboration.  I agree with above. Lew Dawes, MD

## 2019-12-04 NOTE — Patient Instructions (Addendum)
Diana Barker , Thank you for taking time to come for your Medicare Wellness Visit. I appreciate your ongoing commitment to your health goals. Please review the following plan we discussed and let me know if I can assist you in the future.   Screening recommendations/referrals: Colonoscopy: 10/15/2013 Mammogram: 02/22/2015 Bone Density: never done  Recommended yearly ophthalmology/optometry visit for glaucoma screening and checkup Recommended yearly dental visit for hygiene and checkup  Vaccinations: Influenza vaccine: declined Pneumococcal vaccine: declined Tdap vaccine: declined Shingles vaccine: declined   Covid-19: [place] completed  Advanced directives: Advance directive discussed with you today. Even though you declined this today please call our office should you change your mind and we can give you the proper paperwork for you to fill out.  Conditions/risks identified: Please continue to do your personal lifestyle choices by: daily care of teeth and gums, regular physical activity (goal should be 5 days a week for 30 minutes), eat a healthy diet, avoid tobacco and drug use, limiting any alcohol intake, taking a low-dose aspirin (if not allergic or have been advised by your provider otherwise) and taking vitamins and minerals as recommended by your provider. Continue doing brain stimulating activities (puzzles, reading, adult coloring books, staying active) to keep memory sharp. Continue to eat heart healthy diet (full of fruits, vegetables, whole grains, lean protein, water--limit salt, fat, and sugar intake) and increase physical activity as tolerated.  Next appointment: Please schedule your next Medicare Wellness Visit with your Nurse Health Advisor in 1 year.   Preventive Care 40 Years and Older, Female Preventive care refers to lifestyle choices and visits with your health care provider that can promote health and wellness. What does preventive care include?  A yearly physical exam.  This is also called an annual well check.  Dental exams once or twice a year.  Routine eye exams. Ask your health care provider how often you should have your eyes checked.  Personal lifestyle choices, including:  Daily care of your teeth and gums.  Regular physical activity.  Eating a healthy diet.  Avoiding tobacco and drug use.  Limiting alcohol use.  Practicing safe sex.  Taking low-dose aspirin every day.  Taking vitamin and mineral supplements as recommended by your health care provider. What happens during an annual well check? The services and screenings done by your health care provider during your annual well check will depend on your age, overall health, lifestyle risk factors, and family history of disease. Counseling  Your health care provider may ask you questions about your:  Alcohol use.  Tobacco use.  Drug use.  Emotional well-being.  Home and relationship well-being.  Sexual activity.  Eating habits.  History of falls.  Memory and ability to understand (cognition).  Work and work Statistician.  Reproductive health. Screening  You may have the following tests or measurements:  Height, weight, and BMI.  Blood pressure.  Lipid and cholesterol levels. These may be checked every 5 years, or more frequently if you are over 44 years old.  Skin check.  Lung cancer screening. You may have this screening every year starting at age 83 if you have a 30-pack-year history of smoking and currently smoke or have quit within the past 15 years.  Fecal occult blood test (FOBT) of the stool. You may have this test every year starting at age 58.  Flexible sigmoidoscopy or colonoscopy. You may have a sigmoidoscopy every 5 years or a colonoscopy every 10 years starting at age 17.  Hepatitis C blood  test.  Hepatitis B blood test.  Sexually transmitted disease (STD) testing.  Diabetes screening. This is done by checking your blood sugar (glucose) after  you have not eaten for a while (fasting). You may have this done every 1-3 years.  Bone density scan. This is done to screen for osteoporosis. You may have this done starting at age 46.  Mammogram. This may be done every 1-2 years. Talk to your health care provider about how often you should have regular mammograms. Talk with your health care provider about your test results, treatment options, and if necessary, the need for more tests. Vaccines  Your health care provider may recommend certain vaccines, such as:  Influenza vaccine. This is recommended every year.  Tetanus, diphtheria, and acellular pertussis (Tdap, Td) vaccine. You may need a Td booster every 10 years.  Zoster vaccine. You may need this after age 2.  Pneumococcal 13-valent conjugate (PCV13) vaccine. One dose is recommended after age 59.  Pneumococcal polysaccharide (PPSV23) vaccine. One dose is recommended after age 55. Talk to your health care provider about which screenings and vaccines you need and how often you need them. This information is not intended to replace advice given to you by your health care provider. Make sure you discuss any questions you have with your health care provider. Document Released: 05/14/2015 Document Revised: 01/05/2016 Document Reviewed: 02/16/2015 Elsevier Interactive Patient Education  2017 [place] Prevention in the Home Falls can cause injuries. They can happen to people of all ages. There are many things you can do to make your home safe and to help prevent falls. What can I do on the outside of my home?  Regularly fix the edges of walkways and driveways and fix any cracks.  Remove anything that might make you trip as you walk through a door, such as a raised step or threshold.  Trim any bushes or trees on the path to your home.  Use bright outdoor lighting.  Clear any walking paths of anything that might make someone trip, such as rocks or tools.  Regularly  check to see if handrails are loose or broken. Make sure that both sides of any steps have handrails.  Any raised decks and porches should have guardrails on the edges.  Have any leaves, snow, or ice cleared regularly.  Use sand or salt on walking paths during winter.  Clean up any spills in your garage right away. This includes oil or grease spills. What can I do in the bathroom?  Use night lights.  Install grab bars by the toilet and in the tub and shower. Do not use towel bars as grab bars.  Use non-skid mats or decals in the tub or shower.  If you need to sit down in the shower, use a plastic, non-slip stool.  Keep the floor dry. Clean up any water that spills on the floor as soon as it happens.  Remove soap buildup in the tub or shower regularly.  Attach bath mats securely with double-sided non-slip rug tape.  Do not have throw rugs and other things on the floor that can make you trip. What can I do in the bedroom?  Use night lights.  Make sure that you have a light by your bed that is easy to reach.  Do not use any sheets or blankets that are too big for your bed. They should not hang down onto the floor.  Have a firm chair that has side arms. You can  use this for support while you get dressed.  Do not have throw rugs and other things on the floor that can make you trip. What can I do in the kitchen?  Clean up any spills right away.  Avoid walking on wet floors.  Keep items that you use a lot in easy-to-reach places.  If you need to reach something above you, use a strong step stool that has a grab bar.  Keep electrical cords out of the way.  Do not use floor polish or wax that makes floors slippery. If you must use wax, use non-skid floor wax.  Do not have throw rugs and other things on the floor that can make you trip. What can I do with my stairs?  Do not leave any items on the stairs.  Make sure that there are handrails on both sides of the stairs and  use them. Fix handrails that are broken or loose. Make sure that handrails are as long as the stairways.  Check any carpeting to make sure that it is firmly attached to the stairs. Fix any carpet that is loose or worn.  Avoid having throw rugs at the top or bottom of the stairs. If you do have throw rugs, attach them to the floor with carpet tape.  Make sure that you have a light switch at the top of the stairs and the bottom of the stairs. If you do not have them, ask someone to add them for you. What else can I do to help prevent falls?  Wear shoes that:  Do not have high heels.  Have rubber bottoms.  Are comfortable and fit you well.  Are closed at the toe. Do not wear sandals.  If you use a stepladder:  Make sure that it is fully opened. Do not climb a closed stepladder.  Make sure that both sides of the stepladder are locked into place.  Ask someone to hold it for you, if possible.  Clearly mark and make sure that you can see:  Any grab bars or handrails.  First and last steps.  Where the edge of each step is.  Use tools that help you move around (mobility aids) if they are needed. These include:  Canes.  Walkers.  Scooters.  Crutches.  Turn on the lights when you go into a dark area. Replace any light bulbs as soon as they burn out.  Set up your furniture so you have a clear path. Avoid moving your furniture around.  If any of your floors are uneven, fix them.  If there are any pets around you, be aware of where they are.  Review your medicines with your doctor. Some medicines can make you feel dizzy. This can increase your chance of falling. Ask your doctor what other things that you can do to help prevent falls. This information is not intended to replace advice given to you by your health care provider. Make sure you discuss any questions you have with your health care provider. Document Released: 02/11/2009 Document Revised: 09/23/2015 Document  Reviewed: 05/22/2014 Elsevier Interactive Patient Education  2017 [employer].

## 2019-12-08 ENCOUNTER — Ambulatory Visit (INDEPENDENT_AMBULATORY_CARE_PROVIDER_SITE_OTHER): Payer: Medicare HMO | Admitting: Internal Medicine

## 2019-12-08 ENCOUNTER — Encounter: Payer: Self-pay | Admitting: Internal Medicine

## 2019-12-08 ENCOUNTER — Other Ambulatory Visit: Payer: Self-pay

## 2019-12-08 VITALS — BP 160/100 | HR 100 | Temp 98.5°F | Ht 64.0 in | Wt 106.0 lb

## 2019-12-08 DIAGNOSIS — F17200 Nicotine dependence, unspecified, uncomplicated: Secondary | ICD-10-CM

## 2019-12-08 DIAGNOSIS — E559 Vitamin D deficiency, unspecified: Secondary | ICD-10-CM | POA: Diagnosis not present

## 2019-12-08 DIAGNOSIS — E538 Deficiency of other specified B group vitamins: Secondary | ICD-10-CM | POA: Diagnosis not present

## 2019-12-08 DIAGNOSIS — I10 Essential (primary) hypertension: Secondary | ICD-10-CM | POA: Diagnosis not present

## 2019-12-08 DIAGNOSIS — Z0000 Encounter for general adult medical examination without abnormal findings: Secondary | ICD-10-CM

## 2019-12-08 DIAGNOSIS — R69 Illness, unspecified: Secondary | ICD-10-CM | POA: Diagnosis not present

## 2019-12-08 DIAGNOSIS — K219 Gastro-esophageal reflux disease without esophagitis: Secondary | ICD-10-CM

## 2019-12-08 DIAGNOSIS — F1029 Alcohol dependence with unspecified alcohol-induced disorder: Secondary | ICD-10-CM | POA: Diagnosis not present

## 2019-12-08 MED ORDER — ATENOLOL-CHLORTHALIDONE 50-25 MG PO TABS: 1.0000 | ORAL_TABLET | Freq: Every day | ORAL | 3 refills | Status: DC

## 2019-12-08 NOTE — Assessment & Plan Note (Signed)
1/2 ppd Discussed

## 2019-12-08 NOTE — Assessment & Plan Note (Signed)
She knows she must stop drinking and start visiting AA

## 2019-12-08 NOTE — Assessment & Plan Note (Signed)
Worse Restart Tenoretic

## 2019-12-08 NOTE — Assessment & Plan Note (Signed)
Omeprazole

## 2019-12-08 NOTE — Progress Notes (Signed)
Subjective:  Patient ID: Diana Barker, female    DOB: November 26, 1945  Age: 74 y.o. MRN: [id number]  CC: No chief complaint on file.   HPI [name] presents for HTN - ran out of meds for BP; GERD, B12 def COVID vaccinated; pt refused all other shots  Outpatient Medications Prior to Visit  Medication Sig Dispense Refill  . aspirin 81 MG tablet Take 81 mg by mouth daily.      Marland Kitchen atenolol-chlorthalidone (TENORETIC) 50-25 MG tablet TAKE 1 TABLET BY MOUTH DAILY. PATIENT NEEDS OFFICE VISIT BEFORE REFILLS WILL BE GIVEN 90 tablet 3  . calcium-vitamin D (OSCAL WITH D) 500-200 MG-UNIT tablet Take 1 tablet by mouth.    . Cyanocobalamin (VITAMIN B-12) 1000 MCG SUBL Place 1 tablet (1,000 mcg total) under the tongue daily. 100 tablet 3  . omeprazole (PRILOSEC) 40 MG capsule Take 1 capsule (40 mg total) by mouth daily. qam 30 capsule 11   No facility-administered medications prior to visit.    ROS: Review of Systems  Constitutional: Negative for activity change, appetite change, chills, fatigue and unexpected weight change.  HENT: Positive for dental problem. Negative for congestion, mouth sores and sinus pressure.   Eyes: Negative for visual disturbance.  Respiratory: Negative for cough and chest tightness.   Gastrointestinal: Negative for abdominal pain and nausea.  Genitourinary: Negative for difficulty urinating, frequency and vaginal pain.  Musculoskeletal: Negative for back pain and gait problem.  Skin: Negative for pallor and rash.  Neurological: Negative for dizziness, tremors, weakness, numbness and headaches.  Psychiatric/Behavioral: Negative for confusion and sleep disturbance.    Objective:  BP (!) 160/100 (BP Location: Left Arm, Patient Position: Sitting, Cuff Size: Large)   Pulse 100   Temp 98.5 F (36.9 C) (Oral)   Ht 5\' 4"  (1.626 m)   Wt 106 lb (48.1 kg)   SpO2 98%   BMI 18.19 kg/m   BP Readings from Last 3 Encounters:  12/08/19 (!) 160/100  07/16/19 118/90   02/05/18 122/84    Wt Readings from Last 3 Encounters:  12/08/19 106 lb (48.1 kg)  07/16/19 108 lb (49 kg)  02/05/18 105 lb (47.6 kg)    Physical Exam Constitutional:      General: She is not in acute distress.    Appearance: Normal appearance. She is well-developed.  HENT:     Head: Normocephalic.     Right Ear: External ear normal.     Left Ear: External ear normal.     Nose: Nose normal.  Eyes:     General:        Right eye: No discharge.        Left eye: No discharge.     Conjunctiva/sclera: Conjunctivae normal.     Pupils: Pupils are equal, round, and reactive to light.  Neck:     Thyroid: No thyromegaly.     Vascular: No JVD.     Trachea: No tracheal deviation.  Cardiovascular:     Rate and Rhythm: Normal rate and regular rhythm.     Heart sounds: Normal heart sounds.  Pulmonary:     Effort: No respiratory distress.     Breath sounds: No stridor. No wheezing.  Abdominal:     General: Bowel sounds are normal. There is no distension.     Palpations: Abdomen is soft. There is no mass.     Tenderness: There is no abdominal tenderness. There is no guarding or rebound.  Musculoskeletal:  General: No tenderness.     Cervical back: Normal range of motion and neck supple.  Lymphadenopathy:     Cervical: No cervical adenopathy.  Skin:    Findings: No erythema or rash.  Neurological:     Mental Status: She is oriented to person, place, and time.     Cranial Nerves: No cranial nerve deficit.     Motor: No abnormal muscle tone.     Coordination: Coordination normal.     Deep Tendon Reflexes: Reflexes normal.  Psychiatric:        Behavior: Behavior normal.        Thought Content: Thought content normal.        Judgment: Judgment normal.   caries  Lab Results  Component Value Date   WBC 4.2 07/19/2017   HGB 15.7 (H) 07/19/2017   HCT 46.5 (H) 07/19/2017   PLT 243.0 07/19/2017   GLUCOSE 89 07/19/2017   CHOL 215 (H) 10/19/2014   TRIG 143.0 10/19/2014    HDL 84.70 10/19/2014   LDLDIRECT 101.6 12/18/2008   LDLCALC 102 (H) 10/19/2014   ALT 62 (H) 07/19/2017   AST 86 (H) 07/19/2017   NA 140 07/19/2017   K 3.8 07/19/2017   CL 98 07/19/2017   CREATININE 0.77 07/19/2017   BUN 10 07/19/2017   CO2 32 07/19/2017   TSH 0.77 07/19/2017   INR 0.9 ratio 12/18/2008    No results found.  Assessment & Plan:   Diana Kehr, MD

## 2019-12-09 ENCOUNTER — Telehealth: Payer: Self-pay | Admitting: Internal Medicine

## 2019-12-10 ENCOUNTER — Telehealth: Payer: Self-pay | Admitting: Internal Medicine

## 2019-12-11 ENCOUNTER — Telehealth: Payer: Self-pay | Admitting: Internal Medicine

## 2020-06-21 ENCOUNTER — Ambulatory Visit: Payer: Medicare HMO | Admitting: Internal Medicine

## 2020-06-22 ENCOUNTER — Ambulatory Visit: Payer: Medicare HMO | Admitting: Internal Medicine

## 2020-12-06 ENCOUNTER — Other Ambulatory Visit: Payer: Self-pay

## 2020-12-06 ENCOUNTER — Encounter: Payer: Self-pay | Admitting: Internal Medicine

## 2020-12-06 ENCOUNTER — Ambulatory Visit (INDEPENDENT_AMBULATORY_CARE_PROVIDER_SITE_OTHER): Payer: Medicare HMO | Admitting: Internal Medicine

## 2020-12-06 VITALS — BP 156/90 | HR 82 | Temp 98.4°F | Ht 64.0 in | Wt 100.0 lb

## 2020-12-06 DIAGNOSIS — Z0000 Encounter for general adult medical examination without abnormal findings: Secondary | ICD-10-CM | POA: Diagnosis not present

## 2020-12-06 DIAGNOSIS — F17200 Nicotine dependence, unspecified, uncomplicated: Secondary | ICD-10-CM | POA: Diagnosis not present

## 2020-12-06 DIAGNOSIS — R69 Illness, unspecified: Secondary | ICD-10-CM | POA: Diagnosis not present

## 2020-12-06 DIAGNOSIS — E785 Hyperlipidemia, unspecified: Secondary | ICD-10-CM

## 2020-12-06 DIAGNOSIS — E876 Hypokalemia: Secondary | ICD-10-CM

## 2020-12-06 MED ORDER — VITAMIN D3 50 MCG (2000 UT) PO CAPS: 2000.0000 [IU] | ORAL_CAPSULE | Freq: Every day | ORAL | 3 refills | Status: AC

## 2020-12-06 MED ORDER — ATENOLOL-CHLORTHALIDONE 50-25 MG PO TABS: 1.0000 | ORAL_TABLET | Freq: Every day | ORAL | 3 refills | Status: DC

## 2020-12-06 NOTE — Progress Notes (Signed)
Subjective:  Patient ID: Diana Barker, female    DOB: [birth year]  Age: 75 y.o. MRN: [id number]  CC: Annual Exam   HPI Diana Barker presents for a well exam  Outpatient Medications Prior to Visit  Medication Sig Dispense Refill   aspirin 81 MG tablet Take 81 mg by mouth daily.       atenolol-chlorthalidone (TENORETIC) 50-25 MG tablet Take 1 tablet by mouth daily. Patient needs office visit before refills will be given 90 tablet 3   calcium-vitamin D (OSCAL WITH D) 500-200 MG-UNIT tablet Take 1 tablet by mouth.     Cyanocobalamin (VITAMIN B-12) 1000 MCG SUBL Place 1 tablet (1,000 mcg total) under the tongue daily. 100 tablet 3   omeprazole (PRILOSEC) 40 MG capsule Take 1 capsule (40 mg total) by mouth daily. qam (Patient not taking: Reported on 12/06/2020) 30 capsule 11   No facility-administered medications prior to visit.    ROS: Review of Systems  Constitutional:  Negative for activity change, appetite change, chills, fatigue and unexpected weight change.  HENT:  Negative for congestion, mouth sores and sinus pressure.   Eyes:  Negative for visual disturbance.  Respiratory:  Negative for cough and chest tightness.   Gastrointestinal:  Negative for abdominal pain and nausea.  Genitourinary:  Negative for difficulty urinating, frequency and vaginal pain.  Musculoskeletal:  Negative for back pain and gait problem.  Skin:  Negative for pallor and rash.  Neurological:  Negative for dizziness, tremors, weakness, numbness and headaches.  Psychiatric/Behavioral:  Negative for confusion and sleep disturbance.    Objective:  BP (!) 156/90 (BP Location: Left Arm)   Pulse 82   Temp 98.4 F (36.9 C) (Oral)   Ht '5\' 4"'$  (1.626 m)   Wt 100 lb (45.4 kg)   SpO2 97%   BMI 17.16 kg/m   BP Readings from Last 3 Encounters:  12/06/20 (!) 156/90  12/08/19 (!) 160/100  07/16/19 118/90    Wt Readings from Last 3 Encounters:  12/06/20 100 lb (45.4 kg)  12/08/19 106 lb (48.1 kg)   07/16/19 108 lb (49 kg)    Physical Exam Constitutional:      General: She is not in acute distress.    Appearance: Normal appearance. She is well-developed.  HENT:     Head: Normocephalic.     Right Ear: External ear normal.     Left Ear: External ear normal.     Nose: Nose normal.  Eyes:     General:        Right eye: No discharge.        Left eye: No discharge.     Conjunctiva/sclera: Conjunctivae normal.     Pupils: Pupils are equal, round, and reactive to light.  Neck:     Thyroid: No thyromegaly.     Vascular: No JVD.     Trachea: No tracheal deviation.  Cardiovascular:     Rate and Rhythm: Normal rate and regular rhythm.     Heart sounds: Normal heart sounds.  Pulmonary:     Effort: No respiratory distress.     Breath sounds: No stridor. No wheezing.  Abdominal:     General: Bowel sounds are normal. There is no distension.     Palpations: Abdomen is soft. There is no mass.     Tenderness: There is no abdominal tenderness. There is no guarding or rebound.  Musculoskeletal:        General: No tenderness.     Cervical back: Normal  range of motion and neck supple. No rigidity.  Lymphadenopathy:     Cervical: No cervical adenopathy.  Skin:    Findings: No erythema or rash.  Neurological:     Cranial Nerves: No cranial nerve deficit.     Motor: No abnormal muscle tone.     Coordination: Coordination normal.     Deep Tendon Reflexes: Reflexes normal.  Psychiatric:        Behavior: Behavior normal.        Thought Content: Thought content normal.        Judgment: Judgment normal.    Lab Results  Component Value Date   WBC 4.2 07/19/2017   HGB 15.7 (H) 07/19/2017   HCT 46.5 (H) 07/19/2017   PLT 243.0 07/19/2017   GLUCOSE 89 07/19/2017   CHOL 215 (H) 10/19/2014   TRIG 143.0 10/19/2014   HDL 84.70 10/19/2014   LDLDIRECT 101.6 12/18/2008   LDLCALC 102 (H) 10/19/2014   ALT 62 (H) 07/19/2017   AST 86 (H) 07/19/2017   NA 140 07/19/2017   K 3.8 07/19/2017    CL 98 07/19/2017   CREATININE 0.77 07/19/2017   BUN 10 07/19/2017   CO2 32 07/19/2017   TSH 0.77 07/19/2017   INR 0.9 ratio 12/18/2008    No results found.  Assessment & Plan:     Walker Kehr, MD

## 2020-12-06 NOTE — Assessment & Plan Note (Signed)

## 2020-12-06 NOTE — Assessment & Plan Note (Signed)
Discussed smoking 

## 2021-02-10 ENCOUNTER — Other Ambulatory Visit: Payer: Self-pay

## 2021-02-10 ENCOUNTER — Emergency Department (HOSPITAL_COMMUNITY)
Admission: EM | Admit: 2021-02-10 | Discharge: 2021-02-10 | Disposition: A | Discharge status: Home / Self Care | Payer: Medicare HMO | Attending: Emergency Medicine | Admitting: Emergency Medicine

## 2021-02-10 ENCOUNTER — Encounter (HOSPITAL_COMMUNITY): Payer: Self-pay | Admitting: Emergency Medicine

## 2021-02-10 ENCOUNTER — Emergency Department (HOSPITAL_COMMUNITY): Payer: Medicare HMO

## 2021-02-10 DIAGNOSIS — K852 Alcohol induced acute pancreatitis without necrosis or infection: Secondary | ICD-10-CM

## 2021-02-10 DIAGNOSIS — F17210 Nicotine dependence, cigarettes, uncomplicated: Secondary | ICD-10-CM | POA: Diagnosis not present

## 2021-02-10 DIAGNOSIS — R1010 Upper abdominal pain, unspecified: Secondary | ICD-10-CM

## 2021-02-10 DIAGNOSIS — K219 Gastro-esophageal reflux disease without esophagitis: Secondary | ICD-10-CM | POA: Insufficient documentation

## 2021-02-10 DIAGNOSIS — Z7982 Long term (current) use of aspirin: Secondary | ICD-10-CM | POA: Diagnosis not present

## 2021-02-10 DIAGNOSIS — K5730 Diverticulosis of large intestine without perforation or abscess without bleeding: Secondary | ICD-10-CM | POA: Diagnosis not present

## 2021-02-10 DIAGNOSIS — R1011 Right upper quadrant pain: Secondary | ICD-10-CM | POA: Diagnosis not present

## 2021-02-10 DIAGNOSIS — R69 Illness, unspecified: Secondary | ICD-10-CM | POA: Diagnosis not present

## 2021-02-10 DIAGNOSIS — Z85038 Personal history of other malignant neoplasm of large intestine: Secondary | ICD-10-CM | POA: Insufficient documentation

## 2021-02-10 DIAGNOSIS — I10 Essential (primary) hypertension: Secondary | ICD-10-CM | POA: Diagnosis not present

## 2021-02-10 DIAGNOSIS — K6389 Other specified diseases of intestine: Secondary | ICD-10-CM | POA: Diagnosis not present

## 2021-02-10 DIAGNOSIS — R1013 Epigastric pain: Secondary | ICD-10-CM | POA: Diagnosis not present

## 2021-02-10 DIAGNOSIS — K862 Cyst of pancreas: Secondary | ICD-10-CM | POA: Diagnosis not present

## 2021-02-10 LAB — URINALYSIS, ROUTINE W REFLEX MICROSCOPIC
Bilirubin Urine: NEGATIVE
Glucose, UA: NEGATIVE mg/dL
Hgb urine dipstick: NEGATIVE
Ketones, ur: NEGATIVE mg/dL
Nitrite: NEGATIVE
Protein, ur: NEGATIVE mg/dL
Specific Gravity, Urine: 1.014 (ref 1.005–1.030)
pH: 7 (ref 5.0–8.0)

## 2021-02-10 LAB — CBC
HCT: 47 % — ABNORMAL HIGH (ref 36.0–46.0)
Hemoglobin: 15.9 g/dL — ABNORMAL HIGH (ref 12.0–15.0)
MCH: 31.9 pg (ref 26.0–34.0)
MCHC: 33.8 g/dL (ref 30.0–36.0)
MCV: 94.2 fL (ref 80.0–100.0)
Platelets: 172 10*3/uL (ref 150–400)
RBC: 4.99 MIL/uL (ref 3.87–5.11)
RDW: 13.5 % (ref 11.5–15.5)
WBC: 5.3 10*3/uL (ref 4.0–10.5)
nRBC: 0 % (ref 0.0–0.2)

## 2021-02-10 LAB — COMPREHENSIVE METABOLIC PANEL
ALT: 84 U/L — ABNORMAL HIGH (ref 0–44)
AST: 85 U/L — ABNORMAL HIGH (ref 15–41)
Albumin: 4.2 g/dL (ref 3.5–5.0)
Alkaline Phosphatase: 46 U/L (ref 38–126)
Anion gap: 13 (ref 5–15)
BUN: 16 mg/dL (ref 8–23)
CO2: 27 mmol/L (ref 22–32)
Calcium: 9.4 mg/dL (ref 8.9–10.3)
Chloride: 99 mmol/L (ref 98–111)
Creatinine, Ser: 0.73 mg/dL (ref 0.44–1.00)
GFR, Estimated: >60 mL/min (ref >60)
Glucose, Bld: 95 mg/dL (ref 70–99)
Potassium: 3.2 mmol/L — ABNORMAL LOW (ref 3.5–5.1)
Sodium: 139 mmol/L (ref 135–145)
Total Bilirubin: 0.9 mg/dL (ref 0.3–1.2)
Total Protein: 8.1 g/dL (ref 6.5–8.1)

## 2021-02-10 LAB — LIPASE, BLOOD: Lipase: 103 U/L — ABNORMAL HIGH (ref 11–51)

## 2021-02-10 MED ORDER — IOHEXOL 350 MG/ML SOLN
100.0000 mL | Freq: Once | INTRAVENOUS | Status: AC | PRN
  Administered 2021-02-10: 80 mL via INTRAVENOUS

## 2021-02-10 MED ORDER — ONDANSETRON HCL 4 MG/2ML IJ SOLN
4.0000 mg | Freq: Once | INTRAMUSCULAR | Status: AC
  Administered 2021-02-10: 4 mg via INTRAVENOUS
  Filled 2021-02-10: qty 2

## 2021-02-10 MED ORDER — HYDROCODONE-ACETAMINOPHEN 5-325 MG PO TABS: 1.0000 | ORAL_TABLET | Freq: Four times a day (QID) | ORAL | 0 refills | Status: DC | PRN

## 2021-02-10 MED ORDER — MORPHINE SULFATE (PF) 4 MG/ML IV SOLN
4.0000 mg | Freq: Once | INTRAVENOUS | Status: AC
  Administered 2021-02-10: 4 mg via INTRAVENOUS
  Filled 2021-02-10: qty 1

## 2021-02-10 MED ORDER — LACTATED RINGERS IV BOLUS
1000.0000 mL | Freq: Once | INTRAVENOUS | Status: AC
  Administered 2021-02-10: 1000 mL via INTRAVENOUS

## 2021-02-10 NOTE — ED Triage Notes (Signed)
Patient here from home reporting lower abd pain x1 month that increased Monday. States pain when eating. Hx of colon cancer. Denies n/v.

## 2021-02-10 NOTE — ED Notes (Signed)
Pt ambulatory without assistance.  

## 2021-02-10 NOTE — ED Provider Notes (Signed)
[facility] DEPT Provider Note   CSN: [id number] Arrival date & time: 02/10/21  8416     History Chief Complaint  Patient presents with   Abdominal Pain    Diana Barker is a 75 y.o. female.  Patient is a 75 year old female with a history of hypertension, esophageal stricture, colon cancer status postresection, alcoholism and prior pancreas cyst who is presenting today with complaint of abdominal pain.  She reports for the last few weeks every time she tries to eat she has significant upper abdominal pain.  She denies any bloating or change in bowel movements.  The pain is only present if she tries to eat however it does not matter what she eats or the amount that she eats it always hurts.  The pain does not radiate and currently is a 7 out of 10 in the periumbilical epigastric and left upper quadrant area.  She has had no nausea or vomiting.  No diarrhea or constipation.  She is concerned that she may have lost some weight because she has not been eating because it causes pain.  She has been trying to take ibuprofen and Tylenol but it is not helping with her pain.  She denies any acid reflux issues.  She does report that she drinks approximately 4 beers every day after she gets off work.  She denies any NSAID use or aspirin use prior to the pain starting.  She is still taking vitamins and her atenolol.  She denies any cough, congestion or shortness of breath.  She denies any black stools or evidence of blood in her stool.  The history is provided by the patient.  Abdominal Pain     Past Medical History:  Diagnosis Date   Alcoholism [facility])    Colon cancer ([place]) 2010   Dr. Marin Olp   Colon polyp 10/15/2013   Tubular adenoma   Esophageal stricture    HTN (hypertension)    Liver cyst    Pancreas cyst 2010    Patient Active Problem List   Diagnosis Date Noted   Chest pain, atypical 02/05/2018   GERD (gastroesophageal reflux disease) 02/05/2018    Neoplasm of uncertain behavior of skin 02/02/2016   Sebaceous cyst 02/02/2016   Hand pain, left 02/02/2016   Dysphagia, pharyngoesophageal phase 10/19/2014   Well adult exam 10/19/2014   Cough 06/23/2014   Chest wall hematoma 03/12/2013   Herpes zoster 03/12/2013   Chest wall contusion 12/06/2012   Elbow pain 12/06/2012   Left leg pain 06/19/2011   LBP (low back pain) 06/19/2011   Hypokalemia 12/25/2010   Alcohol-induced pancreatitis 12/16/2010   Cystic disease of liver 12/16/2010   Vertigo 10/13/2010   History of malignant neoplasm of large intestine 02/23/2009   Colon cancer ([place]) 12/18/2008   Alcohol dependence ([place]) 12/18/2008   ABDOMINAL PAIN 12/18/2008   Tobacco use disorder 02/24/2008   Essential hypertension 12/08/2006    Past Surgical History:  Procedure Laterality Date   COLECTOMY  2010   hemi/Dr [place] SURGERY  2015   extractions   PORTACATH PLACEMENT  2010   portacath removal  2011     OB History   No obstetric history on file.     Family History  Adopted: Yes  Problem Relation Age of Onset   Hypertension Other    Colon cancer Neg Hx     Social History   Tobacco Use   Smoking status: Every Day    Packs/day: 0.50  Years: 50.00    Pack years: 25.00    Types: Cigarettes   Smokeless tobacco: Never  Substance Use Topics   Alcohol use: Yes    Alcohol/week: 28.0 standard drinks    Types: 28 Cans of beer per week    Comment: 12 oz   Drug use: No    Home Medications Prior to Admission medications   Medication Sig Start Date End Date Taking? Authorizing Provider  aspirin 81 MG tablet Take 81 mg by mouth daily.      [provider]  atenolol-chlorthalidone (TENORETIC) 50-25 MG tablet Take 1 tablet by mouth daily. Patient needs office visit before refills will be given 12/06/20   Plotnikov, Evie Lacks, MD  calcium-vitamin D (OSCAL WITH D) 500-200 MG-UNIT tablet Take 1 tablet by mouth.    [provider]  Cholecalciferol  (VITAMIN D3) 50 MCG (2000 UT) capsule Take 1 capsule (2,000 Units total) by mouth daily. 12/06/20   Plotnikov, Evie Lacks, MD  Cyanocobalamin (VITAMIN B-12) 1000 MCG SUBL Place 1 tablet (1,000 mcg total) under the tongue daily. 11/24/12   Plotnikov, Evie Lacks, MD    Allergies    Patient has no known allergies.  Review of Systems   Review of Systems  Gastrointestinal:  Positive for abdominal pain.  All other systems reviewed and are negative.  Physical Exam Updated Vital Signs BP (!) 158/89   Pulse 60   Temp 98.3 F (36.8 C) (Oral)   Resp 17   Ht 5\' 4"  (1.626 m)   Wt 45.4 kg   SpO2 100%   BMI 17.16 kg/m   Physical Exam Vitals and nursing note reviewed.  Constitutional:      General: She is not in acute distress.    Appearance: She is well-developed.  HENT:     Head: Normocephalic and atraumatic.  Eyes:     Conjunctiva/sclera: Conjunctivae normal.     Pupils: Pupils are equal, round, and reactive to light.  Cardiovascular:     Rate and Rhythm: Normal rate and regular rhythm.     Heart sounds: No murmur heard. Pulmonary:     Effort: Pulmonary effort is normal. No respiratory distress.     Breath sounds: Normal breath sounds. No wheezing or rales.  Abdominal:     General: Abdomen is flat. Bowel sounds are normal. There is no distension.     Palpations: Abdomen is soft.     Tenderness: There is abdominal tenderness in the epigastric area and left upper quadrant. There is no right CVA tenderness, left CVA tenderness, guarding or rebound.  Musculoskeletal:        General: No tenderness. Normal range of motion.     Cervical back: Normal range of motion and neck supple.  Skin:    General: Skin is warm and dry.     Findings: No erythema or rash.  Neurological:     Mental Status: She is alert and oriented to person, place, and time.  Psychiatric:        Behavior: Behavior normal.    ED Results / Procedures / Treatments   Labs (all labs ordered are listed, but only abnormal  results are displayed) Labs Reviewed  LIPASE, BLOOD - Abnormal; Notable for the following components:      Result Value   Lipase 103 (*)    All other components within normal limits  COMPREHENSIVE METABOLIC PANEL - Abnormal; Notable for the following components:   Potassium 3.2 (*)    AST 85 (*)  ALT 84 (*)    All other components within normal limits  CBC - Abnormal; Notable for the following components:   Hemoglobin 15.9 (*)    HCT 47.0 (*)    All other components within normal limits  URINALYSIS, ROUTINE W REFLEX MICROSCOPIC - Abnormal; Notable for the following components:   APPearance HAZY (*)    Leukocytes,Ua TRACE (*)    Bacteria, UA RARE (*)    All other components within normal limits    EKG None  Radiology CT ABDOMEN PELVIS W CONTRAST  Result Date: 02/10/2021 CLINICAL DATA:  History of colon cancer and prior hemicolectomy, presenting with left lower abdominal pain x1 month. EXAM: CT ABDOMEN AND PELVIS WITH CONTRAST TECHNIQUE: Multidetector CT imaging of the abdomen and pelvis was performed using the standard protocol following bolus administration of intravenous contrast. CONTRAST:  15mL OMNIPAQUE IOHEXOL 350 MG/ML SOLN COMPARISON:  Dictation report from abdomen and pelvis CT, dated November 11, 2009, is available. Chest CT with contrast, dated Sep 09, 2014. FINDINGS: Lower chest: No acute abnormality. Hepatobiliary: Multiple subcentimeter cystic appearing areas are seen scattered throughout the right and left lobes of the liver. The largest measures approximately 9 mm in diameter and is seen within the anterior aspect of the left lobe. No gallstones, gallbladder wall thickening, or biliary dilatation. Pancreas: 5 mm and 9 mm cystic appearing areas are seen within the body of the pancreas (axial CT images 25 and 27, CT series 2). Dilatation of the distal aspect of the pancreatic duct is noted near the pancreatic head (7 mm). No surrounding inflammatory changes are identified.  Spleen: A 1.6 cm x 1.4 cm x 1.6 cm well-defined hyperdense lesion is seen within the spleen. A 10.3 cm x 8.4 cm x 11.6 cm cyst, with a thin partially calcified wall, is seen within the left upper quadrant, anterior to the spleen. This is described in the prior report and is stable in size. Adrenals/Urinary Tract: Adrenal glands are unremarkable. Kidneys are normal, without renal calculi, focal lesion, or hydronephrosis. Bladder is unremarkable. Stomach/Bowel: Stomach is within normal limits. The proximal to midportion of the transverse colon is markedly distended (approximately 5.5 cm) and contains a large amount of stool in air. A large air-fluid level is seen. A gradual transition zone is seen without evidence of obstructing mass lesion or inflammatory process (coronal reformatted images 22 through 32, CT series 5). The remaining visualized loops of large and small bowel are normal in caliber. Noninflamed diverticula are seen within the descending and sigmoid colon. Vascular/Lymphatic: Aortic atherosclerosis. A 2.2 cm x 1.4 cm x 1.2 cm area of soft tissue attenuation is seen just above the duodenal bulb and junction of the pancreatic body and head. Reproductive: Uterus and bilateral adnexa are unremarkable. Other: No abdominal wall hernia or abnormality. No abdominopelvic ascites. Musculoskeletal: Multilevel degenerative changes seen throughout the lumbar spine. IMPRESSION: 1. Markedly distended proximal to transverse colon, as described above, which may be chronic in nature. 2. Multiple subcentimeter hepatic cysts and/or hemangiomas. Correlation with hepatic ultrasound is recommended. 3. Large, stable left upper quadrant cyst which may be mesenteric in origin. 4. Subcentimeter benign-appearing pancreatic cysts. 5. Findings which may represent an enlarged lymph node just above the duodenal bulb and junction of the pancreatic body and head. Follow-up with nonemergent MR abdomen and pelvis is recommended. 6. Colonic  diverticulosis. 7. Suspected splenic hemangioma. 8. Aortic atherosclerosis. Aortic Atherosclerosis (ICD10-I70.0). Electronically Signed   By: Virgina Norfolk M.D.   On: 02/10/2021 15:52  Procedures Procedures   Medications Ordered in ED Medications  morphine 4 MG/ML injection 4 mg (4 mg Intravenous Given 02/10/21 1400)  ondansetron (ZOFRAN) injection 4 mg (4 mg Intravenous Given 02/10/21 1359)  lactated ringers bolus 1,000 mL (1,000 mLs Intravenous New Bag/Given 02/10/21 1404)  iohexol (OMNIPAQUE) 350 MG/ML injection 100 mL (80 mLs Intravenous Contrast Given 02/10/21 1502)    ED Course  I have reviewed the triage vital signs and the nursing notes.  Pertinent labs & imaging results that were available during my care of the patient were reviewed by me and considered in my medical decision making (see chart for details).    MDM Rules/Calculators/A&P                           Patient is a 75 year old female presenting today with complaints of abdominal pain.  This has been ongoing for at least the last few weeks but she told the nurse it had been a month.  The pain is exacerbated by eating.  No nausea vomiting or stool changes.  She is well-appearing on exam with normal vital signs.  She does have upper abdominal discomfort.  She does have a history of prior pancreas cyst and continues to drink alcohol daily.  Concern for pancreatitis versus peptic ulcer disease.  Lower suspicion for cholecystitis or hepatitis.  Also with her history of colon cancer and prior obstruction concern for recurrence of cancer.  Patient given pain control and IV fluids.  Labs pending.  Will most likely need CT for further evaluation.  4:16 PM Patient's lipase today is elevated at 103, CMP with potassium of 3.2, AST of 85 and ALT of 84 with normal bilirubin.  CBC without acute findings.  UA without classic findings for infection.  We will do CT to further evaluate.  4:16 PM Patient CT shows marked distended  proximal to the transverse colon which may be chronic in nature with a gradual transition zone and no notable mass.  Also multiple hepatic cysts and/or hemangiomas and large stable left upper quadrant cyst which may be mesenteric in origin as well as subcentimeter pancreatic cysts.  Findings which may represent an enlarged lymph node just above the duodenal bulb and junction of the pancreatic body and head which they recommend MRI in the future.  Given patient's prior history of colon cancer feel that she will need an MRI.  However at this time she is not describing any symptoms suggestive of obstruction.  Most likely mild pancreatitis as she is still drinking alcohol and symptoms are only significant when she is trying to eat.  She has had no nausea and vomiting and she is still having bowel movements.  She will follow-up with her PCP by calling them tomorrow and getting an MRI of her abdomen ordered.  She was given return precautions.  She was sent home with pain control and encouraged to avoid any alcohol.  MDM   Amount and/or Complexity of Data Reviewed Clinical lab tests: ordered and reviewed Tests in the radiology section of CPT: ordered and reviewed Independent visualization of images, tracings, or specimens: yes  Patient Progress Patient progress: stable    Final Clinical Impression(s) / ED Diagnoses Final diagnoses:  Pain of upper abdomen  Alcohol-induced acute pancreatitis without infection or necrosis    Rx / DC Orders ED Discharge Orders          Ordered    HYDROcodone-acetaminophen (NORCO/VICODIN) 5-325 MG tablet  Every  6 hours PRN        02/10/21 1623             Blanchie Dessert, MD 02/10/21 1623

## 2021-02-10 NOTE — Discharge Instructions (Addendum)
So we will be important for you to avoid all alcohol.  Also start with a very bland diet like broths, Jell-O, applesauce and when the pain improves you can gradually go back to your normal diet.  They have a year the food is that needs more digestion the harder the pancreas has to work and it will hurt more.  There were some abnormal findings on your CAT scan with some lymph nodes that were present that will need an MRI in the future to figure out exactly what this is.  Dr. Alain Marion can order this and if the MRI is abnormal get you to the correct specialist.  If your pain starts worsening you start having fever, recurrent vomiting, no bowel movements or other concerns please return to the emergency room.

## 2021-02-14 ENCOUNTER — Telehealth: Payer: Self-pay | Admitting: Internal Medicine

## 2021-02-14 NOTE — Telephone Encounter (Signed)
[facility] FYI 02/12/2021  Caller states she is having mid abdominal pain for about a week. Pain is 7/10 and comes and goes. Pain with pressing it. Last BM about 3 days ago. States she is drinking fluids. States last urine was 20 min ago with no pain.   Advised to See PCP within 24 Hours. Patient understood but was undecided.

## 2021-02-15 ENCOUNTER — Telehealth: Payer: Self-pay | Admitting: Internal Medicine

## 2021-02-15 DIAGNOSIS — K921 Melena: Secondary | ICD-10-CM

## 2021-02-15 NOTE — Telephone Encounter (Signed)
Patient calling to report black stool x1d no other symptoms  Patient requesting labs  Please advise

## 2021-02-16 NOTE — Telephone Encounter (Signed)
OK CBC OV this wk - any provider Thx

## 2021-02-17 NOTE — Telephone Encounter (Signed)
Called pt there was no answer LMOM RTC...lmb 

## 2021-02-18 NOTE — Telephone Encounter (Signed)
Called pt inform her of MD response. Pt states she will hold off on getting labs. Her stool is back to normal. She states it was probably from the medicine she took while in the ER. Pt has appt f/u on Tuesday 25th.Marland KitchenJohny Chess

## 2021-02-22 ENCOUNTER — Encounter: Payer: Self-pay | Admitting: Internal Medicine

## 2021-02-22 ENCOUNTER — Other Ambulatory Visit: Payer: Self-pay

## 2021-02-22 ENCOUNTER — Ambulatory Visit (INDEPENDENT_AMBULATORY_CARE_PROVIDER_SITE_OTHER): Payer: Medicare HMO | Admitting: Internal Medicine

## 2021-02-22 VITALS — BP 142/90 | HR 82 | Temp 98.3°F | Ht 64.0 in | Wt 98.4 lb

## 2021-02-22 DIAGNOSIS — K852 Alcohol induced acute pancreatitis without necrosis or infection: Secondary | ICD-10-CM

## 2021-02-22 DIAGNOSIS — I700 Atherosclerosis of aorta: Secondary | ICD-10-CM | POA: Insufficient documentation

## 2021-02-22 DIAGNOSIS — F1029 Alcohol dependence with unspecified alcohol-induced disorder: Secondary | ICD-10-CM

## 2021-02-22 DIAGNOSIS — F17200 Nicotine dependence, unspecified, uncomplicated: Secondary | ICD-10-CM

## 2021-02-22 DIAGNOSIS — Q446 Cystic disease of liver: Secondary | ICD-10-CM | POA: Diagnosis not present

## 2021-02-22 DIAGNOSIS — R1084 Generalized abdominal pain: Secondary | ICD-10-CM | POA: Diagnosis not present

## 2021-02-22 DIAGNOSIS — R634 Abnormal weight loss: Secondary | ICD-10-CM | POA: Insufficient documentation

## 2021-02-22 DIAGNOSIS — C186 Malignant neoplasm of descending colon: Secondary | ICD-10-CM | POA: Diagnosis not present

## 2021-02-22 DIAGNOSIS — R69 Illness, unspecified: Secondary | ICD-10-CM | POA: Diagnosis not present

## 2021-02-22 NOTE — Progress Notes (Signed)
Subjective:  Patient ID: Diana Barker, female    DOB: [birth year]  Age: 74 y.o. MRN: [id number]  CC: Follow-up [facility] f/u)   HPI NOELENE GANG presents for abdominal pain and ER visit on 02/10/21 f/u. She was drinking "too much beer" prior; not drinking now. Still smoking.  No pain now  Per hx:  "Patient is a 75 year old female presenting today with complaints of abdominal pain.  This has been ongoing for at least the last few weeks but she told the nurse it had been a month.  The pain is exacerbated by eating.  No nausea vomiting or stool changes.  She is well-appearing on exam with normal vital signs.  She does have upper abdominal discomfort.  She does have a history of prior pancreas cyst and continues to drink alcohol daily.  Concern for pancreatitis versus peptic ulcer disease.  Lower suspicion for cholecystitis or hepatitis.  Also with her history of colon cancer and prior obstruction concern for recurrence of cancer.  Patient given pain control and IV fluids.  Labs pending.  Will most likely need CT for further evaluation. Patient CT shows marked distended proximal to the transverse colon which may be chronic in nature with a gradual transition zone and no notable mass.  Also multiple hepatic cysts and/or hemangiomas and large stable left upper quadrant cyst which may be mesenteric in origin as well as subcentimeter pancreatic cysts.  Findings which may represent an enlarged lymph node just above the duodenal bulb and junction of the pancreatic body and head which they recommend MRI in the future.  Given patient's prior history of colon cancer feel that she will need an MRI.  However at this time she is not describing any symptoms suggestive of obstruction.  Most likely mild pancreatitis as she is still drinking alcohol and symptoms are only significant when she is trying to eat.  She has had no nausea and vomiting and she is still having bowel movements.  She will follow-up with her  PCP by calling them tomorrow and getting an MRI of her abdomen ordered.  She was given return precautions.  She was sent home with pain control and encouraged to avoid any alcohol."    Outpatient Medications Prior to Visit  Medication Sig Dispense Refill   aspirin 81 MG tablet Take 81 mg by mouth daily.       atenolol-chlorthalidone (TENORETIC) 50-25 MG tablet Take 1 tablet by mouth daily. Patient needs office visit before refills will be given 90 tablet 3   calcium-vitamin D (OSCAL WITH D) 500-200 MG-UNIT tablet Take 1 tablet by mouth daily.     Cholecalciferol (VITAMIN D3) 50 MCG (2000 UT) capsule Take 1 capsule (2,000 Units total) by mouth daily. 100 capsule 3   Cyanocobalamin (VITAMIN B-12) 1000 MCG SUBL Place 1 tablet (1,000 mcg total) under the tongue daily. 100 tablet 3   HYDROcodone-acetaminophen (NORCO/VICODIN) 5-325 MG tablet Take 1 tablet by mouth every 6 (six) hours as needed for severe pain. 10 tablet 0   Ibuprofen (ADVIL LIQUI-GELS MINIS PO) Take 2 capsules by mouth daily as needed (pain).     No facility-administered medications prior to visit.    ROS: Review of Systems  Constitutional:  Negative for activity change, appetite change, chills, fatigue and unexpected weight change.  HENT:  Negative for congestion, mouth sores and sinus pressure.   Eyes:  Negative for visual disturbance.  Respiratory:  Negative for cough and chest tightness.   Gastrointestinal:  Negative for  abdominal pain and nausea.  Genitourinary:  Negative for difficulty urinating, frequency and vaginal pain.  Musculoskeletal:  Negative for back pain and gait problem.  Skin:  Negative for pallor and rash.  Neurological:  Negative for dizziness, tremors, weakness, numbness and headaches.  Psychiatric/Behavioral:  Negative for confusion and sleep disturbance.    Objective:  BP (!) 142/90 (BP Location: Left Arm)   Pulse 82   Temp 98.3 F (36.8 C) (Oral)   Ht 5\' 4"  (1.626 m)   Wt 98 lb 6.4 oz (44.6 kg)    SpO2 96%   BMI 16.89 kg/m   BP Readings from Last 3 Encounters:  02/22/21 (!) 142/90  02/10/21 (!) 152/82  12/06/20 (!) 156/90    Wt Readings from Last 3 Encounters:  02/22/21 98 lb 6.4 oz (44.6 kg)  02/10/21 100 lb (45.4 kg)  12/06/20 100 lb (45.4 kg)    Physical Exam Constitutional:      General: She is not in acute distress.    Appearance: She is well-developed. She is not ill-appearing or toxic-appearing.  HENT:     Head: Normocephalic.     Right Ear: External ear normal.     Left Ear: External ear normal.     Nose: Nose normal.  Eyes:     General:        Right eye: No discharge.        Left eye: No discharge.     Conjunctiva/sclera: Conjunctivae normal.     Pupils: Pupils are equal, round, and reactive to light.  Neck:     Thyroid: No thyromegaly.     Vascular: No JVD.     Trachea: No tracheal deviation.  Cardiovascular:     Rate and Rhythm: Normal rate and regular rhythm.     Heart sounds: Normal heart sounds.  Pulmonary:     Effort: No respiratory distress.     Breath sounds: No stridor. No wheezing.  Abdominal:     General: Bowel sounds are normal. There is no distension.     Palpations: Abdomen is soft. There is no mass.     Tenderness: There is no abdominal tenderness. There is no guarding or rebound.  Musculoskeletal:        General: No tenderness.     Cervical back: Normal range of motion and neck supple. No rigidity.  Lymphadenopathy:     Cervical: No cervical adenopathy.  Skin:    Findings: No erythema or rash.  Neurological:     Cranial Nerves: No cranial nerve deficit.     Motor: No abnormal muscle tone.     Coordination: Coordination normal.     Deep Tendon Reflexes: Reflexes normal.  Psychiatric:        Behavior: Behavior normal.        Thought Content: Thought content normal.        Judgment: Judgment normal.  Thin  Lab Results  Component Value Date   WBC 5.3 02/10/2021   HGB 15.9 (H) 02/10/2021   HCT 47.0 (H) 02/10/2021   PLT  172 02/10/2021   GLUCOSE 95 02/10/2021   CHOL 215 (H) 10/19/2014   TRIG 143.0 10/19/2014   HDL 84.70 10/19/2014   LDLDIRECT 101.6 12/18/2008   LDLCALC 102 (H) 10/19/2014   ALT 84 (H) 02/10/2021   AST 85 (H) 02/10/2021   NA 139 02/10/2021   K 3.2 (L) 02/10/2021   CL 99 02/10/2021   CREATININE 0.73 02/10/2021   BUN 16 02/10/2021   CO2 27 02/10/2021  TSH 0.77 07/19/2017   INR 0.9 ratio 12/18/2008    CT ABDOMEN PELVIS W CONTRAST  Result Date: 02/10/2021 CLINICAL DATA:  History of colon cancer and prior hemicolectomy, presenting with left lower abdominal pain x1 month. EXAM: CT ABDOMEN AND PELVIS WITH CONTRAST TECHNIQUE: Multidetector CT imaging of the abdomen and pelvis was performed using the standard protocol following bolus administration of intravenous contrast. CONTRAST:  19mL OMNIPAQUE IOHEXOL 350 MG/ML SOLN COMPARISON:  Dictation report from abdomen and pelvis CT, dated November 11, 2009, is available. Chest CT with contrast, dated Sep 09, 2014. FINDINGS: Lower chest: No acute abnormality. Hepatobiliary: Multiple subcentimeter cystic appearing areas are seen scattered throughout the right and left lobes of the liver. The largest measures approximately 9 mm in diameter and is seen within the anterior aspect of the left lobe. No gallstones, gallbladder wall thickening, or biliary dilatation. Pancreas: 5 mm and 9 mm cystic appearing areas are seen within the body of the pancreas (axial CT images 25 and 27, CT series 2). Dilatation of the distal aspect of the pancreatic duct is noted near the pancreatic head (7 mm). No surrounding inflammatory changes are identified. Spleen: A 1.6 cm x 1.4 cm x 1.6 cm well-defined hyperdense lesion is seen within the spleen. A 10.3 cm x 8.4 cm x 11.6 cm cyst, with a thin partially calcified wall, is seen within the left upper quadrant, anterior to the spleen. This is described in the prior report and is stable in size. Adrenals/Urinary Tract: Adrenal glands are  unremarkable. Kidneys are normal, without renal calculi, focal lesion, or hydronephrosis. Bladder is unremarkable. Stomach/Bowel: Stomach is within normal limits. The proximal to midportion of the transverse colon is markedly distended (approximately 5.5 cm) and contains a large amount of stool in air. A large air-fluid level is seen. A gradual transition zone is seen without evidence of obstructing mass lesion or inflammatory process (coronal reformatted images 22 through 32, CT series 5). The remaining visualized loops of large and small bowel are normal in caliber. Noninflamed diverticula are seen within the descending and sigmoid colon. Vascular/Lymphatic: Aortic atherosclerosis. A 2.2 cm x 1.4 cm x 1.2 cm area of soft tissue attenuation is seen just above the duodenal bulb and junction of the pancreatic body and head. Reproductive: Uterus and bilateral adnexa are unremarkable. Other: No abdominal wall hernia or abnormality. No abdominopelvic ascites. Musculoskeletal: Multilevel degenerative changes seen throughout the lumbar spine. IMPRESSION: 1. Markedly distended proximal to transverse colon, as described above, which may be chronic in nature. 2. Multiple subcentimeter hepatic cysts and/or hemangiomas. Correlation with hepatic ultrasound is recommended. 3. Large, stable left upper quadrant cyst which may be mesenteric in origin. 4. Subcentimeter benign-appearing pancreatic cysts. 5. Findings which may represent an enlarged lymph node just above the duodenal bulb and junction of the pancreatic body and head. Follow-up with nonemergent MR abdomen and pelvis is recommended. 6. Colonic diverticulosis. 7. Suspected splenic hemangioma. 8. Aortic atherosclerosis. Aortic Atherosclerosis (ICD10-I70.0). Electronically Signed   By: Virgina Norfolk M.D.   On: 02/10/2021 15:52    Assessment & Plan:   Problem List Items Addressed This Visit     Abdominal pain    She was drinking "too much beer" prior; not  drinking now. Still smoking.      Relevant Orders   Ambulatory referral to Gastroenterology   MR Abdomen W Wo Contrast   Alcohol dependence ([place])    She was drinking "too much beer" prior; not drinking now. Still smoking.  Alcohol-induced pancreatitis - Primary    Recent. She was drinking "too much beer" prior; not drinking now. Still smoking.  02/10/21 Abd CT IMPRESSION:  1. Markedly distended proximal to transverse colon, as described above, which may be chronic in nature. 2. Multiple subcentimeter hepatic cysts and/or hemangiomas. Correlation with hepatic ultrasound is recommended. 3. Large, stable left upper quadrant cyst which may be mesenteric in origin. 4. Subcentimeter benign-appearing pancreatic cysts. 5. Findings which may represent an enlarged lymph node just above the duodenal bulb and junction of the pancreatic body and head. Follow-up with nonemergent MR abdomen and pelvis is recommended. 6. Colonic diverticulosis. 7. Suspected splenic hemangioma. 8. Aortic atherosclerosis.   Aortic Atherosclerosis (ICD10-I70.0).     Electronically Signed   By: Virgina Norfolk M.D.   On: 02/10/2021 15:52      Relevant Orders   Comprehensive metabolic panel   Lipase   Ambulatory referral to Gastroenterology   MR Abdomen W Wo Contrast   Aortic atherosclerosis ([place])    On CT 2022      Colon cancer ([place])    02/10/21 Abd CT IMPRESSION:  1. Markedly distended proximal to transverse colon, as described above, which may be chronic in nature. 2. Multiple subcentimeter hepatic cysts and/or hemangiomas. Correlation with hepatic ultrasound is recommended. 3. Large, stable left upper quadrant cyst which may be mesenteric in origin. 4. Subcentimeter benign-appearing pancreatic cysts. 5. Findings which may represent an enlarged lymph node just above the duodenal bulb and junction of the pancreatic body and head. Follow-up with nonemergent MR abdomen and pelvis is  recommended. 6. Colonic diverticulosis. 7. Suspected splenic hemangioma. 8. Aortic atherosclerosis.   Aortic Atherosclerosis (ICD10-I70.0).     Electronically Signed   By: Virgina Norfolk M.D.   On: 02/10/2021 15:52  The ptis past due colonoscopy - ref to see Dr Henrene Pastor. Check CEA       Relevant Orders   CEA   Comprehensive metabolic panel   TSH   Lipase   Ambulatory referral to Gastroenterology   MR Abdomen W Wo Contrast   Cystic disease of liver    CT reviewed MRI was recomended      Relevant Orders   Ambulatory referral to Gastroenterology   MR Abdomen W Wo Contrast   Tobacco use disorder    Ongoing - discussed      Weight loss    Check TSH Drink Ensure bid      Relevant Orders   Comprehensive metabolic panel   TSH   Ambulatory referral to Gastroenterology   MR Abdomen W Wo Contrast      No orders of the defined types were placed in this encounter.     Follow-up: Return in about 2 weeks (around 03/08/2021) for a follow-up visit.  Walker Kehr, MD

## 2021-02-22 NOTE — Assessment & Plan Note (Signed)
Ongoing - discussed

## 2021-02-22 NOTE — Assessment & Plan Note (Signed)
She was drinking "too much beer" prior; not drinking now. Still smoking.

## 2021-02-22 NOTE — Assessment & Plan Note (Addendum)
02/10/21 Abd CT IMPRESSION:  1. Markedly distended proximal to transverse colon, as described above, which may be chronic in nature. 2. Multiple subcentimeter hepatic cysts and/or hemangiomas. Correlation with hepatic ultrasound is recommended. 3. Large, stable left upper quadrant cyst which may be mesenteric in origin. 4. Subcentimeter benign-appearing pancreatic cysts. 5. Findings which may represent an enlarged lymph node just above the duodenal bulb and junction of the pancreatic body and head. Follow-up with nonemergent MR abdomen and pelvis is recommended. 6. Colonic diverticulosis. 7. Suspected splenic hemangioma. 8. Aortic atherosclerosis.  Aortic Atherosclerosis (ICD10-I70.0).   Electronically Signed   By: Virgina Norfolk M.D.   On: 02/10/2021 15:52  The ptis past due colonoscopy - ref to see Dr Henrene Pastor. Check CEA

## 2021-02-22 NOTE — Assessment & Plan Note (Signed)
CT reviewed MRI was recomended

## 2021-02-22 NOTE — Assessment & Plan Note (Addendum)
Recent. She was drinking "too much beer" prior; not drinking now. Still smoking.  02/10/21 Abd CT IMPRESSION:  1. Markedly distended proximal to transverse colon, as described above, which may be chronic in nature. 2. Multiple subcentimeter hepatic cysts and/or hemangiomas. Correlation with hepatic ultrasound is recommended. 3. Large, stable left upper quadrant cyst which may be mesenteric in origin. 4. Subcentimeter benign-appearing pancreatic cysts. 5. Findings which may represent an enlarged lymph node just above the duodenal bulb and junction of the pancreatic body and head. Follow-up with nonemergent MR abdomen and pelvis is recommended. 6. Colonic diverticulosis. 7. Suspected splenic hemangioma. 8. Aortic atherosclerosis.  Aortic Atherosclerosis (ICD10-I70.0).   Electronically Signed   By: Virgina Norfolk M.D.   On: 02/10/2021 15:52

## 2021-02-22 NOTE — Assessment & Plan Note (Signed)
Check TSH Drink Ensure bid

## 2021-02-22 NOTE — Assessment & Plan Note (Signed)
On CT 2022

## 2021-03-04 ENCOUNTER — Telehealth: Payer: Self-pay | Admitting: Internal Medicine

## 2021-03-04 NOTE — Telephone Encounter (Signed)
N/A unable to leave a message for patient to call me back at [phone] to schedule Medicare Annual Wellness Visit   Last AWV  12/04/19  Please schedule at anytime with [facility] if patient calls the office back.    40 Minutes appointment   Any questions, please call me at [phone]

## 2021-03-07 ENCOUNTER — Ambulatory Visit: Payer: Medicare HMO | Admitting: Internal Medicine

## 2021-03-08 ENCOUNTER — Ambulatory Visit: Payer: Medicare HMO | Admitting: Internal Medicine

## 2021-06-23 ENCOUNTER — Other Ambulatory Visit: Payer: Self-pay

## 2021-06-23 ENCOUNTER — Emergency Department (HOSPITAL_COMMUNITY)
Admission: EM | Admit: 2021-06-23 | Discharge: 2021-06-23 | Disposition: A | Discharge status: Home / Self Care | Payer: Medicare HMO | Attending: Emergency Medicine | Admitting: Emergency Medicine

## 2021-06-23 ENCOUNTER — Encounter (HOSPITAL_COMMUNITY): Payer: Self-pay | Admitting: Emergency Medicine

## 2021-06-23 DIAGNOSIS — M7601 Gluteal tendinitis, right hip: Secondary | ICD-10-CM | POA: Insufficient documentation

## 2021-06-23 DIAGNOSIS — M5431 Sciatica, right side: Secondary | ICD-10-CM

## 2021-06-23 DIAGNOSIS — M79651 Pain in right thigh: Secondary | ICD-10-CM | POA: Insufficient documentation

## 2021-06-23 DIAGNOSIS — Z7982 Long term (current) use of aspirin: Secondary | ICD-10-CM | POA: Diagnosis not present

## 2021-06-23 MED ORDER — PREDNISONE 10 MG PO TABS: ORAL_TABLET | ORAL | 0 refills | Status: DC

## 2021-06-23 NOTE — ED Provider Notes (Signed)
[facility] DEPT Provider Note   CSN: [id number] Arrival date & time: 06/23/21  8416     History  Chief Complaint  Patient presents with   Leg Pain    Diana Barker is a 76 y.o. female.  HPI She presents with complaint of pain in right buttock and right posterior thigh, onset about 6 days ago.  She feels like it is aggravated by the type of work that she does.  She has to pull a lever to left a device during her work activities.  She has tried to decrease her activity and has noted some improvement.  She still has some residual pain in the posterior right thigh.  She denies ongoing back pain or back injuries.    Home Medications Prior to Admission medications   Medication Sig Start Date End Date Taking? Authorizing Provider  aspirin 81 MG tablet Take 81 mg by mouth daily.      [provider]  atenolol-chlorthalidone (TENORETIC) 50-25 MG tablet Take 1 tablet by mouth daily. Patient needs office visit before refills will be given 12/06/20   Plotnikov, Evie Lacks, MD  calcium-vitamin D (OSCAL WITH D) 500-200 MG-UNIT tablet Take 1 tablet by mouth daily.    [provider]  Cholecalciferol (VITAMIN D3) 50 MCG (2000 UT) capsule Take 1 capsule (2,000 Units total) by mouth daily. 12/06/20   Plotnikov, Evie Lacks, MD  Cyanocobalamin (VITAMIN B-12) 1000 MCG SUBL Place 1 tablet (1,000 mcg total) under the tongue daily. 11/24/12   Plotnikov, Evie Lacks, MD  HYDROcodone-acetaminophen (NORCO/VICODIN) 5-325 MG tablet Take 1 tablet by mouth every 6 (six) hours as needed for severe pain. 02/10/21   Blanchie Dessert, MD  Ibuprofen (ADVIL LIQUI-GELS MINIS PO) Take 2 capsules by mouth daily as needed (pain).    [provider]      Allergies    Patient has no known allergies.    Review of Systems   Review of Systems  Physical Exam Updated Vital Signs BP (!) 169/97 (BP Location: Left Arm)    Pulse 66    Temp 97.9 F (36.6 C) (Oral)    Resp 18     SpO2 95%  Physical Exam Vitals and nursing note reviewed.  Constitutional:      General: She is not in acute distress.    Appearance: She is well-developed. She is not ill-appearing or diaphoretic.  HENT:     Head: Normocephalic and atraumatic.     Right Ear: External ear normal.     Left Ear: External ear normal.  Eyes:     Conjunctiva/sclera: Conjunctivae normal.     Pupils: Pupils are equal, round, and reactive to light.  Neck:     Trachea: Phonation normal.  Cardiovascular:     Rate and Rhythm: Normal rate.  Pulmonary:     Effort: Pulmonary effort is normal.  Abdominal:     General: There is no distension.  Musculoskeletal:        General: Normal range of motion.     Cervical back: Normal range of motion and neck supple.     Comments: Normal range of motion arms and legs bilaterally.  No visible or palpable deformity of the right leg.  Right leg nontender palpation  Skin:    General: Skin is warm and dry.  Neurological:     Mental Status: She is alert and oriented to person, place, and time.     Cranial Nerves: No cranial nerve deficit.  Sensory: No sensory deficit.     Motor: No abnormal muscle tone.     Coordination: Coordination normal.  Psychiatric:        Mood and Affect: Mood normal.        Behavior: Behavior normal.        Thought Content: Thought content normal.        Judgment: Judgment normal.    ED Results / Procedures / Treatments   Labs (all labs ordered are listed, but only abnormal results are displayed) Labs Reviewed - No data to display  EKG None  Radiology No results found.  Procedures Procedures    Medications Ordered in ED Medications - No data to display  ED Course/ Medical Decision Making/ A&P                           Medical Decision Making She is presenting with essentially atraumatic pain in her right leg however has a mechanism for overuse of the right leg.  No similar in the past.  Problems Addressed: Sciatica of  right side: undiagnosed new problem with uncertain prognosis    Details: Onset about a week ago.  Amount and/or Complexity of Data Reviewed Independent Historian:     Details: Cogent historian  Risk Prescription drug management. Risk Details: She presents for evaluation of right leg pain without trauma.  She has signs and symptoms of sciatica without worrisome findings for occult malignancy, myelopathy syndromes or fracture.  There is no indication for imaging in the ED.  Release for work for several days and PCP follow-up recommended.           Final Clinical Impression(s) / ED Diagnoses Final diagnoses:  None    Rx / DC Orders ED Discharge Orders     None         Daleen Bo, MD 06/23/21 [phone]

## 2021-06-23 NOTE — Discharge Instructions (Signed)
Use heat on sore areas 3-4 times a day for 30 minutes.  Gradually increase your activity over the next few days.  See your primary care doctor if not better.

## 2021-06-23 NOTE — ED Triage Notes (Signed)
Pt reports pain in right thigh after getting home from work on Sunday. Pt reports it feels like she pulled something

## 2021-09-30 ENCOUNTER — Ambulatory Visit (INDEPENDENT_AMBULATORY_CARE_PROVIDER_SITE_OTHER): Payer: Medicare HMO

## 2021-09-30 DIAGNOSIS — Z0000 Encounter for general adult medical examination without abnormal findings: Secondary | ICD-10-CM

## 2021-09-30 DIAGNOSIS — Z780 Asymptomatic menopausal state: Secondary | ICD-10-CM

## 2021-09-30 NOTE — Progress Notes (Addendum)
Subjective:   Diana Barker is a 76 y.o. female who presents for Medicare Annual (Subsequent) preventive examination.   I connected with Diana Barker today by telephone and verified that I am speaking with the correct person using two identifiers. Location patient: home Location provider: work Persons participating in the virtual visit: patient, provider.   I discussed the limitations, risks, security and privacy concerns of performing an evaluation and management service by telephone and the availability of in person appointments. I also discussed with the patient that there may be a patient responsible charge related to this service. The patient expressed understanding and verbally consented to this telephonic visit.    Interactive audio and video telecommunications were attempted between this provider and patient, however failed, due to patient having technical difficulties OR patient did not have access to video capability.  We continued and completed visit with audio only.    Review of Systems     Cardiac Risk Factors include: advanced age (>23mn, >>2women)     Objective:    Today's Vitals   There is no height or weight on file to calculate BMI.     09/30/2021    3:33 PM 06/23/2021    7:37 AM 02/10/2021    9:47 AM 12/04/2019    2:50 PM 07/16/2019   11:23 AM 09/29/2014    9:01 AM 10/01/2013    8:28 AM  Advanced Directives  Does Patient Have a Medical Advance Directive? No No No Yes No No Patient does not have advance directive  Does patient want to make changes to medical advance directive?    No - Patient declined     Would patient like information on creating a medical advance directive? No - Patient declined No - Patient declined   No - Patient declined      Current Medications (verified) Outpatient Encounter Medications as of 09/30/2021  Medication Sig   aspirin 81 MG tablet Take 81 mg by mouth daily.     atenolol-chlorthalidone (TENORETIC) 50-25 MG tablet Take 1  tablet by mouth daily. Patient needs office visit before refills will be given   calcium-vitamin D (OSCAL WITH D) 500-200 MG-UNIT tablet Take 1 tablet by mouth daily.   Cholecalciferol (VITAMIN D3) 50 MCG (2000 UT) capsule Take 1 capsule (2,000 Units total) by mouth daily.   Cyanocobalamin (VITAMIN B-12) 1000 MCG SUBL Place 1 tablet (1,000 mcg total) under the tongue daily.   HYDROcodone-acetaminophen (NORCO/VICODIN) 5-325 MG tablet Take 1 tablet by mouth every 6 (six) hours as needed for severe pain. (Patient not taking: Reported on 09/30/2021)   Ibuprofen (ADVIL LIQUI-GELS MINIS PO) Take 2 capsules by mouth daily as needed (pain). (Patient not taking: Reported on 09/30/2021)   predniSONE (DELTASONE) 10 MG tablet Take q day 6,5,4,3,2,1 (Patient not taking: Reported on 09/30/2021)   No facility-administered encounter medications on file as of 09/30/2021.    Allergies (verified) Patient has no known allergies.   History: Past Medical History:  Diagnosis Date   Alcoholism ([facility]    Colon cancer ([place] 2010   Dr. EMarin Olp  Colon polyp 10/15/2013   Tubular adenoma   Esophageal stricture    HTN (hypertension)    Liver cyst    Pancreas cyst 2010   Past Surgical History:  Procedure Laterality Date   [facility]  DENTAL SURGERY  2015   extractions   PORTACATH PLACEMENT  2010   portacath removal  2011   Family History  Adopted: Yes  Problem Relation Age of Onset   Hypertension Other    Colon cancer Neg Hx    Social History   Socioeconomic History   Marital status: Single    Spouse name: Not on file   Number of children: Not on file   Years of education: Not on file   Highest education level: Not on file  Occupational History   Occupation: Dietary Aid  Tobacco Use   Smoking status: Every Day    Packs/day: 0.50    Years: 50.00    Pack years: 25.00    Types: Cigarettes   Smokeless tobacco: Never  Substance and Sexual Activity   Alcohol use: Yes     Alcohol/week: 28.0 standard drinks    Types: 28 Cans of beer per week    Comment: 12 oz   Drug use: No   Sexual activity: Not Currently  Other Topics Concern   Not on file  Social History Narrative   Not on file   Social Determinants of Health   Financial Resource Strain: Low Risk    Difficulty of Paying Living Expenses: Not hard at all  Food Insecurity: No Food Insecurity   Worried About Charity fundraiser in the Last Year: Never true   [place] in the Last Year: Never true  Transportation Needs: No Transportation Needs   Lack of Transportation (Medical): No   Lack of Transportation (Non-Medical): No  Physical Activity: Sufficiently Active   Days of Exercise per Week: 5 days   Minutes of Exercise per Session: 30 min  Stress: No Stress Concern Present   Feeling of Stress : Not at all  Social Connections: Moderately Isolated   Frequency of Communication with Friends and Family: Twice a week   Frequency of Social Gatherings with Friends and Family: Twice a week   Attends Religious Services: 1 to 4 times per year   Active Member of [employer] or Organizations: No   Attends Archivist Meetings: Never   Marital Status: Widowed    Tobacco Counseling Ready to quit: Not Answered Counseling given: Not Answered   Clinical Intake:  Pre-visit preparation completed: Yes  Pain : No/denies pain     Nutritional Risks: None Diabetes: No  How often do you need to have someone help you when you read instructions, pamphlets, or other written materials from your doctor or pharmacy?: 1 - Never What is the last grade level you completed in school?: 11th grade  Diabetic?no   Interpreter Needed?: No  Information entered by :: [place]   Activities of Daily Living    09/30/2021    3:37 PM  In your present state of health, do you have any difficulty performing the following activities:  Hearing? 0  Vision? 0  Difficulty concentrating or making decisions? 0   Walking or climbing stairs? 0  Dressing or bathing? 0  Doing errands, shopping? 0  Preparing Food and eating ? N  Using the Toilet? N  In the past six months, have you accidently leaked urine? N  Do you have problems with loss of bowel control? N  Managing your Medications? N  Managing your Finances? N  Housekeeping or managing your Housekeeping? N    Patient Care Team: Plotnikov, Evie Lacks, MD as PCP - Veleta Miners, MD as Consulting Physician (Orthopedic Surgery) Irene Shipper, MD as Consulting Physician (Gastroenterology) Volanda Napoleon, MD as Consulting Physician (Oncology)  Indicate any recent Medical Services you may have received  from other than [facility] providers in the past year (date may be approximate).     Assessment:   This is a routine wellness examination for Diana Barker.  Hearing/Vision screen Vision Screening - Comments:: Declines at this time   Dietary issues and exercise activities discussed: Current Exercise Habits: Home exercise routine, Type of exercise: walking, Time (Minutes): 30, Frequency (Times/Week): 5, Weekly Exercise (Minutes/Week): 150, Intensity: Mild, Exercise limited by: None identified   Goals Addressed   None    Depression Screen    09/30/2021    3:34 PM 09/30/2021    3:32 PM 12/06/2020   11:35 AM 12/04/2019    2:52 PM 07/19/2017    9:32 AM 02/02/2016    1:36 PM 09/09/2014   10:53 AM  PHQ 2/9 Scores  PHQ - 2 Score 0 0 0 0 0 0 1  PHQ- 9 Score   0        Fall Risk    09/30/2021    3:34 PM 12/06/2020   11:35 AM 12/04/2019    2:52 PM 07/19/2017    9:32 AM 02/02/2016    1:36 PM  [place] in the past year? 0 0 0 No No  Number falls in past yr: 0 0 0    Injury with Fall? 0 0 0    Risk for fall due to :  No Fall Risks No Fall Risks    Follow up Falls evaluation completed  Falls evaluation completed      [place]:  Any stairs in or around the home? Yes  If so, are there any without handrails?  No  Home free of loose throw rugs in walkways, pet beds, electrical cords, etc? Yes  Adequate lighting in your home to reduce risk of falls? Yes   ASSISTIVE DEVICES UTILIZED TO PREVENT FALLS:  Life alert? No  Use of a cane, walker or w/c? No  Grab bars in the bathroom? No  Shower chair or bench in shower? No  Elevated toilet seat or a handicapped toilet? No     Cognitive Function:  Normal cognitive status assessed by telephone conversation by this Nurse Health Advisor. No abnormalities found.        Immunizations Immunization History  Administered Date(s) Administered   PFIZER(Purple Top)SARS-COV-2 Vaccination 06/07/2019, 07/02/2019, 01/30/2020, 08/02/2020    TDAP status: Due, Education has been provided regarding the importance of this vaccine. Advised may receive this vaccine at local pharmacy or Health Dept. Aware to provide a copy of the vaccination record if obtained from local pharmacy or Health Dept. Verbalized acceptance and understanding.  Flu Vaccine status: Declined, Education has been provided regarding the importance of this vaccine but patient still declined. Advised may receive this vaccine at local pharmacy or Health Dept. Aware to provide a copy of the vaccination record if obtained from local pharmacy or Health Dept. Verbalized acceptance and understanding.  Pneumococcal vaccine status: Due, Education has been provided regarding the importance of this vaccine. Advised may receive this vaccine at local pharmacy or Health Dept. Aware to provide a copy of the vaccination record if obtained from local pharmacy or Health Dept. Verbalized acceptance and understanding.  Covid-19 vaccine status: Declined, Education has been provided regarding the importance of this vaccine but patient still declined. Advised may receive this vaccine at local pharmacy or Health Dept.or vaccine clinic. Aware to provide a copy of the vaccination record if obtained from local pharmacy or Health  Dept. Verbalized acceptance  and understanding.  Qualifies for Shingles Vaccine? Yes   Zostavax completed No   Shingrix Completed?: No.    Education has been provided regarding the importance of this vaccine. Patient has been advised to call insurance company to determine out of pocket expense if they have not yet received this vaccine. Advised may also receive vaccine at local pharmacy or Health Dept. Verbalized acceptance and understanding.  Screening Tests Health Maintenance  Topic Date Due   Pneumonia Vaccine 63+ Years old (1 - PCV) Never done   Hepatitis C Screening  Never done   TETANUS/TDAP  Never done   Zoster Vaccines- Shingrix (1 of 2) Never done   DEXA SCAN  Never done   COLONOSCOPY (Pts 45-53yr Insurance coverage will need to be confirmed)  10/16/2018   COVID-19 Vaccine (5 - Booster for Pfizer series) 09/27/2020   HPV VACCINES  Aged Out   INFLUENZA VACCINE  Discontinued    Health Maintenance  Health Maintenance Due  Topic Date Due   Pneumonia Vaccine 78 Years old (1 - PCV) Never done   Hepatitis C Screening  Never done   TETANUS/TDAP  Never done   Zoster Vaccines- Shingrix (1 of 2) Never done   DEXA SCAN  Never done   COLONOSCOPY (Pts 45-431yrInsurance coverage will need to be confirmed)  10/16/2018   COVID-19 Vaccine (5 - Booster for PfHughsoneries) 09/27/2020    Colorectal cancer screening: No longer required.   Mammogram status: No longer required due to age.  Bone Density status: Ordered 09/30/2021. Pt provided with contact info and advised to call to schedule appt.  Lung Cancer Screening: (Low Dose CT Chest recommended if Age 78-80ears, 30 pack-year currently smoking OR have quit w/in 15years.) does not qualify.   Lung Cancer Screening Referral: n/a  Additional Screening:  Hepatitis C Screening: does not qualify;   Vision Screening: Recommended annual ophthalmology exams for early detection of glaucoma and other disorders of the eye. Is the patient  up to date with their annual eye exam?  No  Who is the provider or what is the name of the office in which the patient attends annual eye exams? Patient declines  If pt is not established with a provider, would they like to be referred to a provider to establish care? No .   Dental Screening: Recommended annual dental exams for proper oral hygiene  Community Resource Referral / Chronic Care Management: CRR required this visit?  No   CCM required this visit?  No      Plan:     I have personally reviewed and noted the following in the patient's chart:   Medical and social history Use of alcohol, tobacco or illicit drugs  Current medications and supplements including opioid prescriptions.  Functional ability and status Nutritional status Physical activity Advanced directives List of other physicians Hospitalizations, surgeries, and ER visits in previous 12 months Vitals Screenings to include cognitive, depression, and falls Referrals and appointments  In addition, I have reviewed and discussed with patient certain preventive protocols, quality metrics, and best practice recommendations. A written personalized care plan for preventive services as well as general preventive health recommendations were provided to patient.     LaRandel PiggLPN   6/09/05/8467 Nurse Notes: none   Medical screening examination/treatment/procedure(s) were performed by non-physician practitioner and as supervising physician I was immediately available for consultation/collaboration.  I agree with above. AlLew DawesMD

## 2021-09-30 NOTE — Patient Instructions (Signed)
Diana Barker , Thank you for taking time to come for your Medicare Wellness Visit. I appreciate your ongoing commitment to your health goals. Please review the following plan we discussed and let me know if I can assist you in the future.   Screening recommendations/referrals: Colonoscopy: no longer required  Mammogram: no longer required  Bone Density: referral 09/30/2021 Recommended yearly ophthalmology/optometry visit for glaucoma screening and checkup Recommended yearly dental visit for hygiene and checkup  Vaccinations: Influenza vaccine: declined  Pneumococcal vaccine: due  Tdap vaccine: due  Shingles vaccine: declined     Advanced directives: none   Conditions/risks identified: none   Next appointment: none    Preventive Care 78 Years and Older, Female Preventive care refers to lifestyle choices and visits with your health care provider that can promote health and wellness. What does preventive care include? A yearly physical exam. This is also called an annual well check. Dental exams once or twice a year. Routine eye exams. Ask your health care provider how often you should have your eyes checked. Personal lifestyle choices, including: Daily care of your teeth and gums. Regular physical activity. Eating a healthy diet. Avoiding tobacco and drug use. Limiting alcohol use. Practicing safe sex. Taking low-dose aspirin every day. Taking vitamin and mineral supplements as recommended by your health care provider. What happens during an annual well check? The services and screenings done by your health care provider during your annual well check will depend on your age, overall health, lifestyle risk factors, and family history of disease. Counseling  Your health care provider may ask you questions about your: Alcohol use. Tobacco use. Drug use. Emotional well-being. Home and relationship well-being. Sexual activity. Eating habits. History of falls. Memory and  ability to understand (cognition). Work and work Statistician. Reproductive health. Screening  You may have the following tests or measurements: Height, weight, and BMI. Blood pressure. Lipid and cholesterol levels. These may be checked every 5 years, or more frequently if you are over 78 years old. Skin check. Lung cancer screening. You may have this screening every year starting at age 78 if you have a 30-pack-year history of smoking and currently smoke or have quit within the past 15 years. Fecal occult blood test (FOBT) of the stool. You may have this test every year starting at age 78. Flexible sigmoidoscopy or colonoscopy. You may have a sigmoidoscopy every 5 years or a colonoscopy every 10 years starting at age 78. Hepatitis C blood test. Hepatitis B blood test. Sexually transmitted disease (STD) testing. Diabetes screening. This is done by checking your blood sugar (glucose) after you have not eaten for a while (fasting). You may have this done every 1-3 years. Bone density scan. This is done to screen for osteoporosis. You may have this done starting at age 78. Mammogram. This may be done every 1-2 years. Talk to your health care provider about how often you should have regular mammograms. Talk with your health care provider about your test results, treatment options, and if necessary, the need for more tests. Vaccines  Your health care provider may recommend certain vaccines, such as: Influenza vaccine. This is recommended every year. Tetanus, diphtheria, and acellular pertussis (Tdap, Td) vaccine. You may need a Td booster every 10 years. Zoster vaccine. You may need this after age 43. Pneumococcal 13-valent conjugate (PCV13) vaccine. One dose is recommended after age 58. Pneumococcal polysaccharide (PPSV23) vaccine. One dose is recommended after age 30. Talk to your health care provider about which screenings  and vaccines you need and how often you need them. This information is  not intended to replace advice given to you by your health care provider. Make sure you discuss any questions you have with your health care provider. Document Released: 05/14/2015 Document Revised: 01/05/2016 Document Reviewed: 02/16/2015 Elsevier Interactive Patient Education  2017 [place] Prevention in the Home Falls can cause injuries. They can happen to people of all ages. There are many things you can do to make your home safe and to help prevent falls. What can I do on the outside of my home? Regularly fix the edges of walkways and driveways and fix any cracks. Remove anything that might make you trip as you walk through a door, such as a raised step or threshold. Trim any bushes or trees on the path to your home. Use bright outdoor lighting. Clear any walking paths of anything that might make someone trip, such as rocks or tools. Regularly check to see if handrails are loose or broken. Make sure that both sides of any steps have handrails. Any raised decks and porches should have guardrails on the edges. Have any leaves, snow, or ice cleared regularly. Use sand or salt on walking paths during winter. Clean up any spills in your garage right away. This includes oil or grease spills. What can I do in the bathroom? Use night lights. Install grab bars by the toilet and in the tub and shower. Do not use towel bars as grab bars. Use non-skid mats or decals in the tub or shower. If you need to sit down in the shower, use a plastic, non-slip stool. Keep the floor dry. Clean up any water that spills on the floor as soon as it happens. Remove soap buildup in the tub or shower regularly. Attach bath mats securely with double-sided non-slip rug tape. Do not have throw rugs and other things on the floor that can make you trip. What can I do in the bedroom? Use night lights. Make sure that you have a light by your bed that is easy to reach. Do not use any sheets or blankets that  are too big for your bed. They should not hang down onto the floor. Have a firm chair that has side arms. You can use this for support while you get dressed. Do not have throw rugs and other things on the floor that can make you trip. What can I do in the kitchen? Clean up any spills right away. Avoid walking on wet floors. Keep items that you use a lot in easy-to-reach places. If you need to reach something above you, use a strong step stool that has a grab bar. Keep electrical cords out of the way. Do not use floor polish or wax that makes floors slippery. If you must use wax, use non-skid floor wax. Do not have throw rugs and other things on the floor that can make you trip. What can I do with my stairs? Do not leave any items on the stairs. Make sure that there are handrails on both sides of the stairs and use them. Fix handrails that are broken or loose. Make sure that handrails are as long as the stairways. Check any carpeting to make sure that it is firmly attached to the stairs. Fix any carpet that is loose or worn. Avoid having throw rugs at the top or bottom of the stairs. If you do have throw rugs, attach them to the floor with carpet tape.  Make sure that you have a light switch at the top of the stairs and the bottom of the stairs. If you do not have them, ask someone to add them for you. What else can I do to help prevent falls? Wear shoes that: Do not have high heels. Have rubber bottoms. Are comfortable and fit you well. Are closed at the toe. Do not wear sandals. If you use a stepladder: Make sure that it is fully opened. Do not climb a closed stepladder. Make sure that both sides of the stepladder are locked into place. Ask someone to hold it for you, if possible. Clearly mark and make sure that you can see: Any grab bars or handrails. First and last steps. Where the edge of each step is. Use tools that help you move around (mobility aids) if they are needed. These  include: Canes. Walkers. Scooters. Crutches. Turn on the lights when you go into a dark area. Replace any light bulbs as soon as they burn out. Set up your furniture so you have a clear path. Avoid moving your furniture around. If any of your floors are uneven, fix them. If there are any pets around you, be aware of where they are. Review your medicines with your doctor. Some medicines can make you feel dizzy. This can increase your chance of falling. Ask your doctor what other things that you can do to help prevent falls. This information is not intended to replace advice given to you by your health care provider. Make sure you discuss any questions you have with your health care provider. Document Released: 02/11/2009 Document Revised: 09/23/2015 Document Reviewed: 05/22/2014 Elsevier Interactive Patient Education  2017 [employer].

## 2021-10-03 ENCOUNTER — Ambulatory Visit: Payer: Medicare HMO | Admitting: Internal Medicine

## 2021-10-17 ENCOUNTER — Ambulatory Visit: Payer: Medicare HMO | Admitting: Internal Medicine

## 2021-10-19 ENCOUNTER — Ambulatory Visit (INDEPENDENT_AMBULATORY_CARE_PROVIDER_SITE_OTHER): Payer: Medicare HMO | Admitting: Internal Medicine

## 2021-10-19 ENCOUNTER — Ambulatory Visit (INDEPENDENT_AMBULATORY_CARE_PROVIDER_SITE_OTHER): Payer: Medicare HMO

## 2021-10-19 ENCOUNTER — Encounter: Payer: Self-pay | Admitting: Internal Medicine

## 2021-10-19 VITALS — BP 130/90 | HR 60 | Temp 98.0°F | Ht 64.0 in | Wt 94.0 lb

## 2021-10-19 DIAGNOSIS — F17200 Nicotine dependence, unspecified, uncomplicated: Secondary | ICD-10-CM | POA: Diagnosis not present

## 2021-10-19 DIAGNOSIS — F1029 Alcohol dependence with unspecified alcohol-induced disorder: Secondary | ICD-10-CM

## 2021-10-19 DIAGNOSIS — R636 Underweight: Secondary | ICD-10-CM | POA: Diagnosis not present

## 2021-10-19 DIAGNOSIS — E785 Hyperlipidemia, unspecified: Secondary | ICD-10-CM | POA: Diagnosis not present

## 2021-10-19 DIAGNOSIS — K921 Melena: Secondary | ICD-10-CM

## 2021-10-19 DIAGNOSIS — Z0000 Encounter for general adult medical examination without abnormal findings: Secondary | ICD-10-CM

## 2021-10-19 DIAGNOSIS — H539 Unspecified visual disturbance: Secondary | ICD-10-CM

## 2021-10-19 DIAGNOSIS — K852 Alcohol induced acute pancreatitis without necrosis or infection: Secondary | ICD-10-CM

## 2021-10-19 DIAGNOSIS — R634 Abnormal weight loss: Secondary | ICD-10-CM

## 2021-10-19 DIAGNOSIS — I10 Essential (primary) hypertension: Secondary | ICD-10-CM | POA: Diagnosis not present

## 2021-10-19 DIAGNOSIS — C186 Malignant neoplasm of descending colon: Secondary | ICD-10-CM

## 2021-10-19 DIAGNOSIS — J449 Chronic obstructive pulmonary disease, unspecified: Secondary | ICD-10-CM

## 2021-10-19 DIAGNOSIS — R69 Illness, unspecified: Secondary | ICD-10-CM | POA: Diagnosis not present

## 2021-10-19 LAB — URINALYSIS, ROUTINE W REFLEX MICROSCOPIC
Bilirubin Urine: NEGATIVE
Hgb urine dipstick: NEGATIVE
Ketones, ur: NEGATIVE
Nitrite: NEGATIVE
Specific Gravity, Urine: 1.02 (ref 1.000–1.030)
Total Protein, Urine: NEGATIVE
Urine Glucose: NEGATIVE
Urobilinogen, UA: 2 — AB (ref 0.0–1.0)
pH: 6 (ref 5.0–8.0)

## 2021-10-19 LAB — COMPREHENSIVE METABOLIC PANEL
ALT: 65 U/L — ABNORMAL HIGH (ref 0–35)
AST: 54 U/L — ABNORMAL HIGH (ref 0–37)
Albumin: 3.9 g/dL (ref 3.5–5.2)
Alkaline Phosphatase: 46 U/L (ref 39–117)
BUN: 19 mg/dL (ref 6–23)
CO2: 32 mEq/L (ref 19–32)
Calcium: 9.4 mg/dL (ref 8.4–10.5)
Chloride: 101 mEq/L (ref 96–112)
Creatinine, Ser: 0.99 mg/dL (ref 0.40–1.20)
GFR: 55 mL/min — ABNORMAL LOW (ref >60.00)
Glucose, Bld: 89 mg/dL (ref 70–99)
Potassium: 3.6 mEq/L (ref 3.5–5.1)
Sodium: 139 mEq/L (ref 135–145)
Total Bilirubin: 0.5 mg/dL (ref 0.2–1.2)
Total Protein: 7.2 g/dL (ref 6.0–8.3)

## 2021-10-19 LAB — CBC WITH DIFFERENTIAL/PLATELET
Basophils Absolute: 0 10*3/uL (ref 0.0–0.1)
Basophils Relative: 0.7 % (ref 0.0–3.0)
Eosinophils Absolute: 0 10*3/uL (ref 0.0–0.7)
Eosinophils Relative: 0.7 % (ref 0.0–5.0)
HCT: 43.1 % (ref 36.0–46.0)
Hemoglobin: 13.9 g/dL (ref 12.0–15.0)
Lymphocytes Relative: 62.2 % — ABNORMAL HIGH (ref 12.0–46.0)
Lymphs Abs: 3.1 10*3/uL (ref 0.7–4.0)
MCHC: 32.1 g/dL (ref 30.0–36.0)
MCV: 97.4 fl (ref 78.0–100.0)
Monocytes Absolute: 0.4 10*3/uL (ref 0.1–1.0)
Monocytes Relative: 8.7 % (ref 3.0–12.0)
Neutro Abs: 1.4 10*3/uL (ref 1.4–7.7)
Neutrophils Relative %: 27.7 % — ABNORMAL LOW (ref 43.0–77.0)
Platelets: 172 10*3/uL (ref 150.0–400.0)
RBC: 4.43 Mil/uL (ref 3.87–5.11)
RDW: 13.8 % (ref 11.5–15.5)
WBC: 5 10*3/uL (ref 4.0–10.5)

## 2021-10-19 LAB — TSH: TSH: 0.96 u[IU]/mL (ref 0.35–5.50)

## 2021-10-19 LAB — LIPID PANEL
Cholesterol: 162 mg/dL (ref 0–200)
HDL: 89.9 mg/dL (ref >39.00)
LDL Cholesterol: 57 mg/dL (ref 0–99)
NonHDL: 72.57
Total CHOL/HDL Ratio: 2
Triglycerides: 77 mg/dL (ref 0.0–149.0)
VLDL: 15.4 mg/dL (ref 0.0–40.0)

## 2021-10-19 LAB — LIPASE: Lipase: 28 U/L (ref 11.0–59.0)

## 2021-10-19 MED ORDER — ATENOLOL-CHLORTHALIDONE 50-25 MG PO TABS: 1.0000 | ORAL_TABLET | Freq: Every day | ORAL | 3 refills | Status: DC

## 2021-10-19 MED ORDER — MEGESTROL ACETATE 40 MG PO TABS: 80.0000 mg | ORAL_TABLET | Freq: Every day | ORAL | 3 refills | Status: DC

## 2021-10-19 NOTE — Assessment & Plan Note (Signed)
Likely causing wt loss Discussed

## 2021-10-19 NOTE — Assessment & Plan Note (Signed)
Discussed Drinking 4 beers a day

## 2021-10-19 NOTE — Assessment & Plan Note (Signed)
Cont on Atenolol HCT

## 2021-10-19 NOTE — Progress Notes (Signed)
Subjective:  Patient ID: Diana Barker, female    DOB: Aug 15, 1945  Age: 76 y.o. MRN: [id number]  CC: No chief complaint on file.   HPI Diana Barker presents for wt loss Appetite is good - eating well Drinking 4 beers a day Working nights  Wt Readings from Last 3 Encounters:  10/19/21 94 lb (42.6 kg)  02/22/21 98 lb 6.4 oz (44.6 kg)  02/10/21 100 lb (45.4 kg)     Outpatient Medications Prior to Visit  Medication Sig Dispense Refill   aspirin 81 MG tablet Take 81 mg by mouth daily.       calcium-vitamin D (OSCAL WITH D) 500-200 MG-UNIT tablet Take 1 tablet by mouth daily.     Cholecalciferol (VITAMIN D3) 50 MCG (2000 UT) capsule Take 1 capsule (2,000 Units total) by mouth daily. 100 capsule 3   Cyanocobalamin (VITAMIN B-12) 1000 MCG SUBL Place 1 tablet (1,000 mcg total) under the tongue daily. 100 tablet 3   atenolol-chlorthalidone (TENORETIC) 50-25 MG tablet Take 1 tablet by mouth daily. Patient needs office visit before refills will be given 90 tablet 3   HYDROcodone-acetaminophen (NORCO/VICODIN) 5-325 MG tablet Take 1 tablet by mouth every 6 (six) hours as needed for severe pain. (Patient not taking: Reported on 09/30/2021) 10 tablet 0   Ibuprofen (ADVIL LIQUI-GELS MINIS PO) Take 2 capsules by mouth daily as needed (pain). (Patient not taking: Reported on 09/30/2021)     predniSONE (DELTASONE) 10 MG tablet Take q day 6,5,4,3,2,1 (Patient not taking: Reported on 09/30/2021) 21 tablet 0   No facility-administered medications prior to visit.    ROS: Review of Systems  Constitutional:  Positive for unexpected weight change. Negative for activity change, appetite change, chills and fatigue.  HENT:  Negative for congestion, mouth sores and sinus pressure.   Eyes:  Positive for visual disturbance.  Respiratory:  Negative for cough and chest tightness.   Gastrointestinal:  Negative for abdominal pain and nausea.  Genitourinary:  Negative for difficulty urinating, frequency and  vaginal pain.  Musculoskeletal:  Negative for back pain and gait problem.  Skin:  Negative for pallor and rash.  Neurological:  Negative for dizziness, tremors, weakness, numbness and headaches.  Psychiatric/Behavioral:  Negative for confusion and sleep disturbance.     Objective:  BP 130/90 (BP Location: Left Arm, Patient Position: Sitting, Cuff Size: Normal)   Pulse 60   Temp 98 F (36.7 C) (Oral)   Ht '5\' 4"'$  (1.626 m)   Wt 94 lb (42.6 kg)   SpO2 97%   BMI 16.14 kg/m   BP Readings from Last 3 Encounters:  10/19/21 130/90  06/23/21 (!) 141/93  02/22/21 (!) 142/90    Wt Readings from Last 3 Encounters:  10/19/21 94 lb (42.6 kg)  02/22/21 98 lb 6.4 oz (44.6 kg)  02/10/21 100 lb (45.4 kg)    Physical Exam Constitutional:      General: She is not in acute distress.    Appearance: She is well-developed. She is not ill-appearing.  HENT:     Head: Normocephalic.     Right Ear: External ear normal.     Left Ear: External ear normal.     Nose: Nose normal.  Eyes:     General:        Right eye: No discharge.        Left eye: No discharge.     Conjunctiva/sclera: Conjunctivae normal.     Pupils: Pupils are equal, round, and reactive to light.  Neck:     Thyroid: No thyromegaly.     Vascular: No JVD.     Trachea: No tracheal deviation.  Cardiovascular:     Rate and Rhythm: Normal rate and regular rhythm.     Heart sounds: Normal heart sounds.  Pulmonary:     Effort: No respiratory distress.     Breath sounds: No stridor. No wheezing.  Abdominal:     General: Bowel sounds are normal. There is no distension.     Palpations: Abdomen is soft. There is no mass.     Tenderness: There is no abdominal tenderness. There is no guarding or rebound.  Musculoskeletal:        General: No tenderness.     Cervical back: Normal range of motion and neck supple. No rigidity.  Lymphadenopathy:     Cervical: No cervical adenopathy.  Skin:    Findings: No erythema or rash.   Neurological:     Mental Status: She is oriented to person, place, and time.     Cranial Nerves: No cranial nerve deficit.     Motor: No abnormal muscle tone.     Coordination: Coordination normal.     Deep Tendon Reflexes: Reflexes normal.  Psychiatric:        Behavior: Behavior normal.        Thought Content: Thought content normal.        Judgment: Judgment normal.   Thin  Lab Results  Component Value Date   WBC 5.3 02/10/2021   HGB 15.9 (H) 02/10/2021   HCT 47.0 (H) 02/10/2021   PLT 172 02/10/2021   GLUCOSE 95 02/10/2021   CHOL 215 (H) 10/19/2014   TRIG 143.0 10/19/2014   HDL 84.70 10/19/2014   LDLDIRECT 101.6 12/18/2008   LDLCALC 102 (H) 10/19/2014   ALT 84 (H) 02/10/2021   AST 85 (H) 02/10/2021   NA 139 02/10/2021   K 3.2 (L) 02/10/2021   CL 99 02/10/2021   CREATININE 0.73 02/10/2021   BUN 16 02/10/2021   CO2 27 02/10/2021   TSH 0.77 07/19/2017   INR 0.9 ratio 12/18/2008    No results found.  Assessment & Plan:   Problem List Items Addressed This Visit     Alcohol dependence ([place])    Discussed Drinking 4 beers a day      Essential hypertension    Cont on Atenolol HCT      Relevant Medications   atenolol-chlorthalidone (TENORETIC) 50-25 MG tablet   Tobacco use disorder    Likely causing wt loss Discussed      Underweight    BMI 16 Likely smoking causing wt loss Discussed options, ie to stop smoking Start Megace  Potential benefits of a long term anabolic steroid  use as well as potential risks  and complications were explained to the patient and were aknowledged. Risks of DVT discussed - on ASA CXR Labs      Other Visit Diagnoses     Chronic obstructive pulmonary disease, unspecified COPD type ([place])    -  Primary   Relevant Orders   DG Chest 2 View   Vision changes       Relevant Orders   Ambulatory referral to Ophthalmology         Meds ordered this encounter  Medications   atenolol-chlorthalidone (TENORETIC) 50-25 MG  tablet    Sig: Take 1 tablet by mouth daily. Patient needs office visit before refills will be given    Dispense:  90 tablet  Refill:  3   megestrol (MEGACE) 40 MG tablet    Sig: Take 2 tablets (80 mg total) by mouth daily.    Dispense:  60 tablet    Refill:  3      Follow-up: Return in about 2 months (around 12/19/2021) for a follow-up visit.  Walker Kehr, MD

## 2021-10-19 NOTE — Assessment & Plan Note (Addendum)
BMI 16 Likely smoking causing wt loss Discussed options, ie to stop smoking Start Megace  Potential benefits of a long term anabolic steroid  use as well as potential risks  and complications were explained to the patient and were aknowledged. Risks of DVT discussed - on ASA CXR Labs

## 2021-10-20 LAB — CEA: CEA: <2 ng/mL

## 2021-10-26 ENCOUNTER — Other Ambulatory Visit: Payer: Self-pay | Admitting: Internal Medicine

## 2021-12-19 ENCOUNTER — Ambulatory Visit (INDEPENDENT_AMBULATORY_CARE_PROVIDER_SITE_OTHER): Payer: Medicare HMO | Admitting: Internal Medicine

## 2021-12-19 ENCOUNTER — Encounter: Payer: Self-pay | Admitting: Internal Medicine

## 2021-12-19 DIAGNOSIS — R636 Underweight: Secondary | ICD-10-CM | POA: Diagnosis not present

## 2021-12-19 DIAGNOSIS — I10 Essential (primary) hypertension: Secondary | ICD-10-CM | POA: Diagnosis not present

## 2021-12-19 MED ORDER — ATENOLOL-CHLORTHALIDONE 50-25 MG PO TABS: 1.0000 | ORAL_TABLET | Freq: Every day | ORAL | 3 refills | Status: DC

## 2021-12-19 NOTE — Assessment & Plan Note (Signed)
Chronic. On Rx: Atenolol HCT

## 2021-12-19 NOTE — Progress Notes (Signed)
   Subjective:  Patient ID: Diana Barker, female    DOB: Aug 06, 1945  Age: 76 y.o. MRN: [id number]  CC: Follow-up (2 month f/u- Pt states she stop taking the megace. Med made her legs feel heavy, itching all over )   HPI Diana Barker presents for wt loss. Pt developed itching on Megace and stopped it... F/iu HTN  Outpatient Medications Prior to Visit  Medication Sig Dispense Refill   aspirin 81 MG tablet Take 81 mg by mouth daily.       atenolol-chlorthalidone (TENORETIC) 50-25 MG tablet Take 1 tablet by mouth daily. Patient needs office visit before refills will be given 90 tablet 3   calcium-vitamin D (OSCAL WITH D) 500-200 MG-UNIT tablet Take 1 tablet by mouth daily.     Cholecalciferol (VITAMIN D3) 50 MCG (2000 UT) capsule Take 1 capsule (2,000 Units total) by mouth daily. 100 capsule 3   Cyanocobalamin (VITAMIN B-12) 1000 MCG SUBL Place 1 tablet (1,000 mcg total) under the tongue daily. 100 tablet 3   megestrol (MEGACE) 40 MG tablet TAKE 2 TABLETS BY MOUTH EVERY DAY (Patient not taking: Reported on 12/19/2021) 180 tablet 1   No facility-administered medications prior to visit.    ROS: Review of Systems  Objective:  BP (!) 148/84 (BP Location: Left Arm)   Pulse 62   Temp 98.3 F (36.8 C) (Oral)   Ht '5\' 4"'$  (1.626 m)   Wt 90 lb 6.4 oz (41 kg)   SpO2 98%   BMI 15.52 kg/m   BP Readings from Last 3 Encounters:  12/19/21 (!) 148/84  10/19/21 130/90  06/23/21 (!) 141/93    Wt Readings from Last 3 Encounters:  12/19/21 90 lb 6.4 oz (41 kg)  10/19/21 94 lb (42.6 kg)  02/22/21 98 lb 6.4 oz (44.6 kg)    Physical Exam  Lab Results  Component Value Date   WBC 5.0 10/19/2021   HGB 13.9 10/19/2021   HCT 43.1 10/19/2021   PLT 172.0 10/19/2021   GLUCOSE 89 10/19/2021   CHOL 162 10/19/2021   TRIG 77.0 10/19/2021   HDL 89.90 10/19/2021   LDLDIRECT 101.6 12/18/2008   LDLCALC 57 10/19/2021   ALT 65 (H) 10/19/2021   AST 54 (H) 10/19/2021   NA 139 10/19/2021   K  3.6 10/19/2021   CL 101 10/19/2021   CREATININE 0.99 10/19/2021   BUN 19 10/19/2021   CO2 32 10/19/2021   TSH 0.96 10/19/2021   INR 0.9 ratio 12/18/2008    No results found.  Assessment & Plan:   Problem List Items Addressed This Visit     Underweight    Likely smoking is causing wt loss Discussed options, ie to stop smoking Megace - d/c due to side effects Other options discussed: pt declined          No orders of the defined types were placed in this encounter.     Follow-up: No follow-ups on file.  Walker Kehr, MD

## 2021-12-19 NOTE — Assessment & Plan Note (Addendum)
Pt states she did not loose wt Likely smoking is causing wt loss Discussed options, ie to stop smoking Megace - d/c due to side effects Other options discussed: pt declined Use Ensure, Boost

## 2022-04-19 ENCOUNTER — Ambulatory Visit (INDEPENDENT_AMBULATORY_CARE_PROVIDER_SITE_OTHER)
Admission: RE | Admit: 2022-04-19 | Discharge: 2022-04-19 | Disposition: A | Discharge status: Home / Self Care | Payer: Medicare HMO | Source: Ambulatory Visit | Attending: Internal Medicine | Admitting: Internal Medicine

## 2022-04-19 DIAGNOSIS — Z780 Asymptomatic menopausal state: Secondary | ICD-10-CM | POA: Diagnosis not present

## 2022-04-21 ENCOUNTER — Other Ambulatory Visit: Payer: Medicare HMO

## 2022-07-16 ENCOUNTER — Ambulatory Visit (HOSPITAL_COMMUNITY)
Admission: EM | Admit: 2022-07-16 | Discharge: 2022-07-16 | Disposition: A | Discharge status: Home / Self Care | Payer: Medicare HMO | Attending: Emergency Medicine | Admitting: Emergency Medicine

## 2022-07-16 ENCOUNTER — Encounter (HOSPITAL_COMMUNITY): Payer: Self-pay

## 2022-07-16 DIAGNOSIS — M79604 Pain in right leg: Secondary | ICD-10-CM

## 2022-07-16 MED ORDER — PREDNISONE 10 MG (21) PO TBPK: ORAL_TABLET | Freq: Every day | ORAL | 0 refills | Status: DC

## 2022-07-16 MED ORDER — DEXAMETHASONE SODIUM PHOSPHATE 10 MG/ML IJ SOLN
5.0000 mg | Freq: Once | INTRAMUSCULAR | Status: AC
  Administered 2022-07-16: 5 mg via INTRAMUSCULAR

## 2022-07-16 MED ORDER — DEXAMETHASONE SODIUM PHOSPHATE 10 MG/ML IJ SOLN
INTRAMUSCULAR | Status: AC
  Filled 2022-07-16: qty 1

## 2022-07-16 NOTE — ED Triage Notes (Signed)
Patient c/o right inner thigh pain x 2 weeks. Patient denies any injury or exercising.  Patient states she has been taking Advil, but none in the pst week.

## 2022-07-16 NOTE — Discharge Instructions (Addendum)
We have given you a steroid injection in clinic.  Tomorrow please start the steroid Dosepak, this should help with your inflammation.  Please be cautious taking ibuprofen with your steroid, as this increases your risk for gastrointestinal bleeding.  You can take Tylenol as needed for your pain.  Please use heating pad and gentle stretching.  Please follow-up with [facility], you can call and set up an appointment on Monday for further evaluation.  Please seek immediate care if you develop numbness, tingling, incontinence or worsening of symptoms.

## 2022-07-16 NOTE — ED Provider Notes (Signed)
[facility]    CSN: [id number] Arrival date & time: 07/16/22  1004      History   Chief Complaint Chief Complaint  Patient presents with   Leg Pain    HPI Diana Barker is a 77 y.o. female.   Right inner thigh pain that radiates down to her knee, it comes on intermittently throughout the day, has been ongoing for the past two weeks.  Reports her pain is worsened with movement and lying down, and is hard for her to get in the bathtub due to the pain.  She has been taking ibuprofen, which sometimes helps.  Denies any numbness, tingling, saddle anesthesia or incontinence.  Denies any trauma or falls.  Denies back pain, dysuria or fevers.  She does drink ETOH daily, drinks 3-4 beers on her off days from work. Denies drinking on days she does work.     The history is provided by the patient.  Leg Pain Associated symptoms: no back pain and no fever     Past Medical History:  Diagnosis Date   Alcoholism ([place])    Colon cancer ([place]) 2010   Dr. Marin Olp   Colon polyp 10/15/2013   Tubular adenoma   Esophageal stricture    HTN (hypertension)    Liver cyst    Pancreas cyst 2010    Patient Active Problem List   Diagnosis Date Noted   Underweight 10/19/2021   Weight loss 02/22/2021   Aortic atherosclerosis ([place]) 02/22/2021   Chest pain, atypical 02/05/2018   GERD (gastroesophageal reflux disease) 02/05/2018   Neoplasm of uncertain behavior of skin 02/02/2016   Sebaceous cyst 02/02/2016   Hand pain, left 02/02/2016   Dysphagia, pharyngoesophageal phase 10/19/2014   Well adult exam 10/19/2014   Cough 06/23/2014   Chest wall hematoma 03/12/2013   Herpes zoster 03/12/2013   Chest wall contusion 12/06/2012   Elbow pain 12/06/2012   Left leg pain 06/19/2011   LBP (low back pain) 06/19/2011   Hypokalemia 12/25/2010   Alcohol-induced pancreatitis 12/16/2010   Cystic disease of liver 12/16/2010   Vertigo 10/13/2010   History of malignant neoplasm of large  intestine 02/23/2009   Colon cancer ([place]) 12/18/2008   Alcohol dependence ([place]) 12/18/2008   Abdominal pain 12/18/2008   Tobacco use disorder 02/24/2008   Essential hypertension 12/08/2006    Past Surgical History:  Procedure Laterality Date   COLECTOMY  2010   hemi/Dr [place] SURGERY  2015   extractions   PORTACATH PLACEMENT  2010   portacath removal  2011    OB History   No obstetric history on file.      Home Medications    Prior to Admission medications   Medication Sig Start Date End Date Taking? Authorizing Provider  predniSONE (STERAPRED UNI-PAK 21 TAB) 10 MG (21) TBPK tablet Take by mouth daily. Take 6 tabs by mouth daily  for 2 days, then 5 tabs for 2 days, then 4 tabs for 2 days, then 3 tabs for 2 days, 2 tabs for 2 days, then 1 tab by mouth daily for 2 days 07/16/22  Yes Louretta Shorten, [place], FNP  aspirin 81 MG tablet Take 81 mg by mouth daily.      [provider]  atenolol-chlorthalidone (TENORETIC) 50-25 MG tablet Take 1 tablet by mouth daily. Patient needs office visit before refills will be given 12/19/21   Plotnikov, Evie Lacks, MD  calcium-vitamin D (OSCAL WITH D) 500-200 MG-UNIT tablet Take 1 tablet by mouth  daily.    [provider]  Cholecalciferol (VITAMIN D3) 50 MCG (2000 UT) capsule Take 1 capsule (2,000 Units total) by mouth daily. 12/06/20   Plotnikov, Evie Lacks, MD  Cyanocobalamin (VITAMIN B-12) 1000 MCG SUBL Place 1 tablet (1,000 mcg total) under the tongue daily. 11/24/12   Plotnikov, Evie Lacks, MD    Family History Family History  Adopted: Yes  Problem Relation Age of Onset   Hypertension Other    Colon cancer Neg Hx     Social History Social History   Tobacco Use   Smoking status: Every Day    Packs/day: 0.50    Years: 50.00    Additional pack years: 0.00    Total pack years: 25.00    Types: Cigarettes   Smokeless tobacco: Never  Vaping Use   Vaping Use: Never used  Substance Use Topics   Alcohol use: Yes     Alcohol/week: 28.0 standard drinks of alcohol    Types: 28 Cans of beer per week   Drug use: No     Allergies   Megace [megestrol]   Review of Systems Review of Systems  Constitutional:  Negative for chills and fever.  Eyes:  Negative for pain and visual disturbance.  Respiratory:  Negative for cough.   Cardiovascular:  Negative for chest pain and palpitations.  Gastrointestinal:  Negative for abdominal pain and vomiting.  Genitourinary:  Negative for dysuria and hematuria.  Musculoskeletal:  Positive for arthralgias. Negative for back pain.  Skin:  Negative for color change and rash.  Neurological:  Negative for seizures and syncope.  All other systems reviewed and are negative.    Physical Exam Triage Vital Signs ED Triage Vitals  Enc Vitals Group     BP 07/16/22 1032 (!) 160/96     Pulse Rate 07/16/22 1032 64     Resp 07/16/22 1032 14     Temp 07/16/22 1032 98 F (36.7 C)     Temp Source 07/16/22 1032 Oral     SpO2 07/16/22 1032 97 %     Weight --      Height --      Head Circumference --      Peak Flow --      Pain Score 07/16/22 1034 7     Pain Loc --      Pain Edu? --      Excl. in [place]? --    No data found.  Updated Vital Signs BP (!) 160/96 (BP Location: Left Arm) Comment: Patient has not taken her BP med today.  Pulse 64   Temp 98 F (36.7 C) (Oral)   Resp 14   SpO2 97%   Visual Acuity Right Eye Distance:   Left Eye Distance:   Bilateral Distance:    Right Eye Near:   Left Eye Near:    Bilateral Near:     Physical Exam Vitals and nursing note reviewed.  Constitutional:      General: She is not in acute distress.    Appearance: She is well-developed.  HENT:     Head: Normocephalic and atraumatic.  Eyes:     Conjunctiva/sclera: Conjunctivae normal.  Cardiovascular:     Rate and Rhythm: Normal rate and regular rhythm.     Heart sounds: Normal heart sounds. No murmur heard. Pulmonary:     Effort: Pulmonary effort is normal. No  respiratory distress.     Breath sounds: Normal breath sounds.  Musculoskeletal:        General:  No swelling, tenderness, deformity or signs of injury. Normal range of motion.     Cervical back: Neck supple.       Legs:     Comments: Pain that starts at right hip and radiates down to her knee.  Sensation and range of motion intact. Strength 5/5 bilaterally. Negative straight leg raise.   Skin:    General: Skin is warm and dry.     Capillary Refill: Capillary refill takes less than 2 seconds.  Neurological:     Mental Status: She is alert.  Psychiatric:        Mood and Affect: Mood normal.        Behavior: Behavior is cooperative.      UC Treatments / Results  Labs (all labs ordered are listed, but only abnormal results are displayed) Labs Reviewed - No data to display  EKG   Radiology No results found.  Procedures Procedures (including critical care time)  Medications Ordered in UC Medications  dexamethasone (DECADRON) injection 5 mg (5 mg Intramuscular Given 07/16/22 1107)    Initial Impression / Assessment and Plan / UC Course  I have reviewed the triage vital signs and the nursing notes.  Pertinent labs & imaging results that were available during my care of the patient were reviewed by me and considered in my medical decision making (see chart for details).  Vitals and triage reviewed, patient is hemodynamically stable.  Hypertensive with history of similar.  Right leg pain that starts at right anterior hip, radiates down to her knee.  Sensation intact.  Negative straight leg raise test.  Without red flag symptoms of saddle anesthesia or incontinence.  Afebrile.  Pain elicited with laying down or movement, discussed that it could be nerve or musculoskeletal related.  Deferred imaging due to lack of back pain, trauma or falls.  Will trial steroids, encouraged orthopedic follow-up if pain persist.  Strict emergency precautions given, patient verbalized understanding.  No  questions at this time.  Final Clinical Impressions(s) / UC Diagnoses   Final diagnoses:  Right leg pain     Discharge Instructions      We have given you a steroid injection in clinic.  Tomorrow please start the steroid Dosepak, this should help with your inflammation.  Please be cautious taking ibuprofen with your steroid, as this increases your risk for gastrointestinal bleeding.  You can take Tylenol as needed for your pain.  Please use heating pad and gentle stretching.  Please follow-up with [facility], you can call and set up an appointment on Monday for further evaluation.  Please seek immediate care if you develop numbness, tingling, incontinence or worsening of symptoms.      ED Prescriptions     Medication Sig Dispense Auth. Provider   predniSONE (STERAPRED UNI-PAK 21 TAB) 10 MG (21) TBPK tablet Take by mouth daily. Take 6 tabs by mouth daily  for 2 days, then 5 tabs for 2 days, then 4 tabs for 2 days, then 3 tabs for 2 days, 2 tabs for 2 days, then 1 tab by mouth daily for 2 days 42 tablet Louretta Shorten, [place], [place]      PDMP not reviewed this encounter.   Sharita Bienaime, [place], [place] 07/16/22 1152

## 2022-08-23 ENCOUNTER — Telehealth: Payer: Self-pay | Admitting: Internal Medicine

## 2022-08-23 NOTE — Telephone Encounter (Signed)
Contacted Diana Barker to schedule their annual wellness visit. Appointment made for 08/29/2022.  [facility] Care Guide [facility] AWV TEAM Direct Dial: [phone]

## 2022-08-29 ENCOUNTER — Ambulatory Visit (INDEPENDENT_AMBULATORY_CARE_PROVIDER_SITE_OTHER): Payer: Medicare HMO

## 2022-08-29 VITALS — Ht 64.0 in | Wt 90.0 lb

## 2022-08-29 DIAGNOSIS — Z0000 Encounter for general adult medical examination without abnormal findings: Secondary | ICD-10-CM | POA: Diagnosis not present

## 2022-08-29 NOTE — Progress Notes (Addendum)
I connected with  Diana Barker on 08/29/22 by a audio enabled telemedicine application and verified that I am speaking with the correct person using two identifiers.  Patient Location: Home  Provider Location: Office/Clinic  I discussed the limitations of evaluation and management by telemedicine. The patient expressed understanding and agreed to proceed.  Subjective:   Diana Barker is a 77 y.o. female who presents for Medicare Annual (Subsequent) preventive examination.  Review of Systems     Cardiac Risk Factors include: advanced age (>39men, >76 women);hypertension;smoking/ tobacco exposure;Other (see comment) (ETOH)     Objective:    Today's Vitals   08/29/22 0850 08/29/22 0851  Weight: 90 lb (40.8 kg)   Height: 5\' 4"  (1.626 m)   PainSc: 5  5   PainLoc: Leg    Body mass index is 15.45 kg/m.     08/29/2022    8:52 AM 09/30/2021    3:33 PM 06/23/2021    7:37 AM 02/10/2021    9:47 AM 12/04/2019    2:50 PM 07/16/2019   11:23 AM 09/29/2014    9:01 AM  Advanced Directives  Does Patient Have a Medical Advance Directive? No No No No Yes No No  Does patient want to make changes to medical advance directive?     No - Patient declined    Would patient like information on creating a medical advance directive? No - Patient declined No - Patient declined No - Patient declined   No - Patient declined     Current Medications (verified) Outpatient Encounter Medications as of 08/29/2022  Medication Sig   aspirin 81 MG tablet Take 81 mg by mouth daily.     atenolol-chlorthalidone (TENORETIC) 50-25 MG tablet Take 1 tablet by mouth daily. Patient needs office visit before refills will be given   calcium-vitamin D (OSCAL WITH D) 500-200 MG-UNIT tablet Take 1 tablet by mouth daily.   Cholecalciferol (VITAMIN D3) 50 MCG (2000 UT) capsule Take 1 capsule (2,000 Units total) by mouth daily.   Cyanocobalamin (VITAMIN B-12) 1000 MCG SUBL Place 1 tablet (1,000 mcg total) under the tongue  daily.   predniSONE (STERAPRED UNI-PAK 21 TAB) 10 MG (21) TBPK tablet Take by mouth daily. Take 6 tabs by mouth daily  for 2 days, then 5 tabs for 2 days, then 4 tabs for 2 days, then 3 tabs for 2 days, 2 tabs for 2 days, then 1 tab by mouth daily for 2 days   No facility-administered encounter medications on file as of 08/29/2022.    Allergies (verified) Megace [megestrol]   History: Past Medical History:  Diagnosis Date   Alcoholism (HCC)    Colon cancer (HCC) 2010   Dr. Myna Hidalgo   Colon polyp 10/15/2013   Tubular adenoma   Esophageal stricture    HTN (hypertension)    Liver cyst    Pancreas cyst 2010   Past Surgical History:  Procedure Laterality Date   COLECTOMY  2010   hemi/Dr Fleeta Emmer   DENTAL SURGERY  2015   extractions   PORTACATH PLACEMENT  2010   portacath removal  2011   Family History  Adopted: Yes  Problem Relation Age of Onset   Hypertension Other    Colon cancer Neg Hx    Social History   Socioeconomic History   Marital status: Single    Spouse name: Not on file   Number of children: Not on file   Years of education: Not on file   Highest education level: Not  on file  Occupational History   Occupation: Dietary Aid  Tobacco Use   Smoking status: Every Day    Packs/day: 0.50    Years: 50.00    Additional pack years: 0.00    Total pack years: 25.00    Types: Cigarettes   Smokeless tobacco: Never  Vaping Use   Vaping Use: Never used  Substance and Sexual Activity   Alcohol use: Yes    Alcohol/week: 28.0 standard drinks of alcohol    Types: 28 Cans of beer per week   Drug use: No   Sexual activity: Not Currently  Other Topics Concern   Not on file  Social History Narrative   Not on file   Social Determinants of Health   Financial Resource Strain: Low Risk  (08/29/2022)   Overall Financial Resource Strain (CARDIA)    Difficulty of Paying Living Expenses: Not hard at all  Food Insecurity: No Food Insecurity (08/29/2022)   Hunger Vital Sign     Worried About Running Out of Food in the Last Year: Never true    Ran Out of Food in the Last Year: Never true  Transportation Needs: No Transportation Needs (08/29/2022)   PRAPARE - Administrator, Civil Service (Medical): No    Lack of Transportation (Non-Medical): No  Physical Activity: Inactive (08/29/2022)   Exercise Vital Sign    Days of Exercise per Week: 0 days    Minutes of Exercise per Session: 0 min  Stress: No Stress Concern Present (08/29/2022)   [employer] of Occupational Health - Occupational Stress Questionnaire    Feeling of Stress : Not at all  Social Connections: Moderately Isolated (08/29/2022)   Social Connection and Isolation Panel [NHANES]    Frequency of Communication with Friends and Family: Twice a week    Frequency of Social Gatherings with Friends and Family: Twice a week    Attends Religious Services: 1 to 4 times per year    Active Member of [employer] or Organizations: No    Attends Banker Meetings: Never    Marital Status: Widowed    Tobacco Counseling Ready to quit: Not Answered Counseling given: Not Answered   Clinical Intake:  Pre-visit preparation completed: Yes  Pain : 0-10 Pain Score: 5      BMI - recorded: 15.45 Nutritional Status: BMI <19  Underweight Nutritional Risks: None Diabetes: No  How often do you need to have someone help you when you read instructions, pamphlets, or other written materials from your doctor or pharmacy?: 1 - Never What is the last grade level you completed in school?: HSG  Diabetic? No  Interpreter Needed?: No  Information entered by :: Susie Cassette, LPN.   Activities of Daily Living    08/29/2022    8:55 AM 09/30/2021    3:37 PM  In your present state of health, do you have any difficulty performing the following activities:  Hearing? 0 0  Vision? 0 0  Difficulty concentrating or making decisions? 0 0  Walking or climbing stairs? 1 0  Dressing or bathing? 0 0   Doing errands, shopping? 0 0  Preparing Food and eating ? N N  Using the Toilet? N N  In the past six months, have you accidently leaked urine? N N  Do you have problems with loss of bowel control? N N  Managing your Medications? N N  Managing your Finances? N N  Housekeeping or managing your Housekeeping? N N    Patient Care  Team: Tresa Garter, MD as PCP - Clarisa Kindred, MD as Consulting Physician (Orthopedic Surgery) Hilarie Fredrickson, MD as Consulting Physician (Gastroenterology) Myna Hidalgo Rose Phi, MD as Consulting Physician (Oncology)  Indicate any recent Medical Services you may have received from other than [facility] providers in the past year (date may be approximate).     Assessment:   This is a routine wellness examination for Diana Barker.  Hearing/Vision screen Hearing Screening - Comments:: Denies hearing difficulties.  Vision Screening - Comments:: Patient wears readers. Not up to date with eye exam.  Dietary issues and exercise activities discussed: Current Exercise Habits: The patient has a physically strenuous job, but has no regular exercise apart from work., Exercise limited by: orthopedic condition(s)   Goals Addressed             This Visit's Progress    My goal for 2024 is to gain weight.        Depression Screen    08/29/2022    8:55 AM 09/30/2021    3:34 PM 09/30/2021    3:32 PM 12/06/2020   11:35 AM 12/04/2019    2:52 PM 07/19/2017    9:32 AM 02/02/2016    1:36 PM  PHQ 2/9 Scores  PHQ - 2 Score 0 0 0 0 0 0 0  PHQ- 9 Score    0       Fall Risk    08/29/2022    8:53 AM 09/30/2021    3:34 PM 12/06/2020   11:35 AM 12/04/2019    2:52 PM 07/19/2017    9:32 AM  Fall Risk   Falls in the past year? 0 0 0 0 No  Number falls in past yr: 0 0 0 0   Injury with Fall? 0 0 0 0   Risk for fall due to : No Fall Risks  No Fall Risks No Fall Risks   Follow up Falls prevention discussed Falls evaluation completed  Falls evaluation completed     FALL RISK  PREVENTION PERTAINING TO THE HOME:  Any stairs in or around the home? Yes  outside only If so, are there any without handrails? No  Home free of loose throw rugs in walkways, pet beds, electrical cords, etc? Yes  Adequate lighting in your home to reduce risk of falls? Yes   ASSISTIVE DEVICES UTILIZED TO PREVENT FALLS:  Life alert? No  Use of a cane, walker or w/c? No  Grab bars in the bathroom? No  Shower chair or bench in shower? No  Elevated toilet seat or a handicapped toilet? No   TIMED UP AND GO:  Was the test performed? No . Telephonic Visit  Cognitive Function:        08/29/2022    8:56 AM  6CIT Screen  What Year? 0 points  What month? 0 points  What time? 0 points  Count back from 20 0 points  Months in reverse 0 points  Repeat phrase 0 points  Total Score 0 points    Immunizations Immunization History  Administered Date(s) Administered   PFIZER(Purple Top)SARS-COV-2 Vaccination 06/07/2019, 07/02/2019, 01/30/2020, 08/02/2020    TDAP status: Declined, Education has been provided regarding the importance of this vaccine but patient still declined. Advised may receive this vaccine at local pharmacy or Health Dept. Aware to provide a copy of the vaccination record if obtained from local pharmacy or Health Dept. Verbalized acceptance and understanding.  Flu Vaccine status: Declined, Education has been provided regarding the importance of this  vaccine but patient still declined. Advised may receive this vaccine at local pharmacy or Health Dept. Aware to provide a copy of the vaccination record if obtained from local pharmacy or Health Dept. Verbalized acceptance and understanding.  Pneumococcal vaccine status: Declined,  Education has been provided regarding the importance of this vaccine but patient still declined. Advised may receive this vaccine at local pharmacy or Health Dept. Aware to provide a copy of the vaccination record if obtained from local pharmacy or  Health Dept. Verbalized acceptance and understanding.   Covid-19 vaccine status: Completed vaccines  Qualifies for Shingles Vaccine? Yes   Zostavax completed No   Shingrix Completed?: No.    Education has been provided regarding the importance of this vaccine. Patient has been advised to call insurance company to determine out of pocket expense if they have not yet received this vaccine. Advised may also receive vaccine at local pharmacy or Health Dept. Verbalized acceptance and understanding.  Screening Tests Health Maintenance  Topic Date Due   Pneumonia Vaccine 73+ Years old (1 of 2 - PCV) Never done   Hepatitis C Screening  Never done   DTaP/Tdap/Td (1 - Tdap) Never done   Lung Cancer Screening  09/09/2015   COLONOSCOPY (Pts 45-13yrs Insurance coverage will need to be confirmed)  10/16/2018   COVID-19 Vaccine (5 - 2023-24 season) 12/30/2021   Medicare Annual Wellness (AWV)  08/29/2023   DEXA SCAN  Completed   HPV VACCINES  Aged Out   INFLUENZA VACCINE  Discontinued   Zoster Vaccines- Shingrix  Discontinued    Health Maintenance  Health Maintenance Due  Topic Date Due   Pneumonia Vaccine 19+ Years old (1 of 2 - PCV) Never done   Hepatitis C Screening  Never done   DTaP/Tdap/Td (1 - Tdap) Never done   Lung Cancer Screening  09/09/2015   COLONOSCOPY (Pts 45-20yrs Insurance coverage will need to be confirmed)  10/16/2018   COVID-19 Vaccine (5 - 2023-24 season) 12/30/2021    Colorectal cancer screening: Type of screening: Colonoscopy. Completed 10/15/2013. Repeat every 5 years  Mammogram status: Completed 02/22/2015. Repeat every year Patient declined.  Bone Density status: Completed 04/19/2022. Results reflect: Bone density results: OSTEOPENIA. Repeat every 2-3 years.  Lung Cancer Screening: (Low Dose CT Chest recommended if Age 40-80 years, 30 pack-year currently smoking OR have quit w/in 15years.) does qualify.   Lung Cancer Screening Referral: Patient  declined.  Additional Screening:  Hepatitis C Screening: does qualify; Completed: No  Vision Screening: Recommended annual ophthalmology exams for early detection of glaucoma and other disorders of the eye. Is the patient up to date with their annual eye exam?  No  Who is the provider or what is the name of the office in which the patient attends annual eye exams? Patient declined. If pt is not established with a provider, would they like to be referred to a provider to establish care? No .   Dental Screening: Recommended annual dental exams for proper oral hygiene  Community Resource Referral / Chronic Care Management: CRR required this visit?  No   CCM required this visit?  No      Plan:     I have personally reviewed and noted the following in the patient's chart:   Medical and social history Use of alcohol, tobacco or illicit drugs  Current medications and supplements including opioid prescriptions. Patient is not currently taking opioid prescriptions. Functional ability and status Nutritional status Physical activity Advanced directives List of other physicians Hospitalizations,  surgeries, and ER visits in previous 12 months Vitals Screenings to include cognitive, depression, and falls Referrals and appointments  In addition, I have reviewed and discussed with patient certain preventive protocols, quality metrics, and best practice recommendations. A written personalized care plan for preventive services as well as general preventive health recommendations were provided to patient.     Mickeal Needy, LPN   1/61/0960   Nurse Notes:  Normal cognitive status assessed by direct observation via telephone conversation by this Nurse Health Advisor. No abnormalities found.     Medical screening examination/treatment/procedure(s) were performed by non-physician practitioner and as supervising physician I was immediately available for consultation/collaboration.  I  agree with above. Jacinta Shoe, MD

## 2022-08-29 NOTE — Patient Instructions (Signed)
[name] , Thank you for taking time to come for your Medicare Wellness Visit. I appreciate your ongoing commitment to your health goals. Please review the following plan we discussed and let me know if I can assist you in the future.   These are the goals we discussed:  Goals      My goal for 2024 is to gain weight.        This is a list of the screening recommended for you and due dates:  Health Maintenance  Topic Date Due   Pneumonia Vaccine (1 of 2 - PCV) Never done   Hepatitis C Screening: USPSTF Recommendation to screen - Ages 73-79 yo.  Never done   DTaP/Tdap/Td vaccine (1 - Tdap) Never done   Screening for Lung Cancer  09/09/2015   Colon Cancer Screening  10/16/2018   COVID-19 Vaccine (5 - 2023-24 season) 12/30/2021   Medicare Annual Wellness Visit  08/29/2023   DEXA scan (bone density measurement)  Completed   HPV Vaccine  Aged Out   Flu Shot  Discontinued   Zoster (Shingles) Vaccine  Discontinued    Advanced directives: No.  Conditions/risks identified: Yes  Next appointment: Follow up in one year for your annual wellness visit.   Preventive Care 63 Years and Older, Female Preventive care refers to lifestyle choices and visits with your health care provider that can promote health and wellness. What does preventive care include? A yearly physical exam. This is also called an annual well check. Dental exams once or twice a year. Routine eye exams. Ask your health care provider how often you should have your eyes checked. Personal lifestyle choices, including: Daily care of your teeth and gums. Regular physical activity. Eating a healthy diet. Avoiding tobacco and drug use. Limiting alcohol use. Practicing safe sex. Taking low-dose aspirin every day. Taking vitamin and mineral supplements as recommended by your health care provider. What happens during an annual well check? The services and screenings done by your health care provider during your annual well  check will depend on your age, overall health, lifestyle risk factors, and family history of disease. Counseling  Your health care provider may ask you questions about your: Alcohol use. Tobacco use. Drug use. Emotional well-being. Home and relationship well-being. Sexual activity. Eating habits. History of falls. Memory and ability to understand (cognition). Work and work Astronomer. Reproductive health. Screening  You may have the following tests or measurements: Height, weight, and BMI. Blood pressure. Lipid and cholesterol levels. These may be checked every 5 years, or more frequently if you are over 78 years old. Skin check. Lung cancer screening. You may have this screening every year starting at age 41 if you have a 30-pack-year history of smoking and currently smoke or have quit within the past 15 years. Fecal occult blood test (FOBT) of the stool. You may have this test every year starting at age 76. Flexible sigmoidoscopy or colonoscopy. You may have a sigmoidoscopy every 5 years or a colonoscopy every 10 years starting at age 22. Hepatitis C blood test. Hepatitis B blood test. Sexually transmitted disease (STD) testing. Diabetes screening. This is done by checking your blood sugar (glucose) after you have not eaten for a while (fasting). You may have this done every 1-3 years. Bone density scan. This is done to screen for osteoporosis. You may have this done starting at age 38. Mammogram. This may be done every 1-2 years. Talk to your health care provider about how often you should  have regular mammograms. Talk with your health care provider about your test results, treatment options, and if necessary, the need for more tests. Vaccines  Your health care provider may recommend certain vaccines, such as: Influenza vaccine. This is recommended every year. Tetanus, diphtheria, and acellular pertussis (Tdap, Td) vaccine. You may need a Td booster every 10 years. Zoster  vaccine. You may need this after age 69. Pneumococcal 13-valent conjugate (PCV13) vaccine. One dose is recommended after age 39. Pneumococcal polysaccharide (PPSV23) vaccine. One dose is recommended after age 43. Talk to your health care provider about which screenings and vaccines you need and how often you need them. This information is not intended to replace advice given to you by your health care provider. Make sure you discuss any questions you have with your health care provider. Document Released: 05/14/2015 Document Revised: 01/05/2016 Document Reviewed: 02/16/2015 Elsevier Interactive Patient Education  2017 [place] Prevention in the Home Falls can cause injuries. They can happen to people of all ages. There are many things you can do to make your home safe and to help prevent falls. What can I do on the outside of my home? Regularly fix the edges of walkways and driveways and fix any cracks. Remove anything that might make you trip as you walk through a door, such as a raised step or threshold. Trim any bushes or trees on the path to your home. Use bright outdoor lighting. Clear any walking paths of anything that might make someone trip, such as rocks or tools. Regularly check to see if handrails are loose or broken. Make sure that both sides of any steps have handrails. Any raised decks and porches should have guardrails on the edges. Have any leaves, snow, or ice cleared regularly. Use sand or salt on walking paths during winter. Clean up any spills in your garage right away. This includes oil or grease spills. What can I do in the bathroom? Use night lights. Install grab bars by the toilet and in the tub and shower. Do not use towel bars as grab bars. Use non-skid mats or decals in the tub or shower. If you need to sit down in the shower, use a plastic, non-slip stool. Keep the floor dry. Clean up any water that spills on the floor as soon as it happens. Remove  soap buildup in the tub or shower regularly. Attach bath mats securely with double-sided non-slip rug tape. Do not have throw rugs and other things on the floor that can make you trip. What can I do in the bedroom? Use night lights. Make sure that you have a light by your bed that is easy to reach. Do not use any sheets or blankets that are too big for your bed. They should not hang down onto the floor. Have a firm chair that has side arms. You can use this for support while you get dressed. Do not have throw rugs and other things on the floor that can make you trip. What can I do in the kitchen? Clean up any spills right away. Avoid walking on wet floors. Keep items that you use a lot in easy-to-reach places. If you need to reach something above you, use a strong step stool that has a grab bar. Keep electrical cords out of the way. Do not use floor polish or wax that makes floors slippery. If you must use wax, use non-skid floor wax. Do not have throw rugs and other things on the floor  that can make you trip. What can I do with my stairs? Do not leave any items on the stairs. Make sure that there are handrails on both sides of the stairs and use them. Fix handrails that are broken or loose. Make sure that handrails are as long as the stairways. Check any carpeting to make sure that it is firmly attached to the stairs. Fix any carpet that is loose or worn. Avoid having throw rugs at the top or bottom of the stairs. If you do have throw rugs, attach them to the floor with carpet tape. Make sure that you have a light switch at the top of the stairs and the bottom of the stairs. If you do not have them, ask someone to add them for you. What else can I do to help prevent falls? Wear shoes that: Do not have high heels. Have rubber bottoms. Are comfortable and fit you well. Are closed at the toe. Do not wear sandals. If you use a stepladder: Make sure that it is fully opened. Do not climb a  closed stepladder. Make sure that both sides of the stepladder are locked into place. Ask someone to hold it for you, if possible. Clearly mark and make sure that you can see: Any grab bars or handrails. First and last steps. Where the edge of each step is. Use tools that help you move around (mobility aids) if they are needed. These include: Canes. Walkers. Scooters. Crutches. Turn on the lights when you go into a dark area. Replace any light bulbs as soon as they burn out. Set up your furniture so you have a clear path. Avoid moving your furniture around. If any of your floors are uneven, fix them. If there are any pets around you, be aware of where they are. Review your medicines with your doctor. Some medicines can make you feel dizzy. This can increase your chance of falling. Ask your doctor what other things that you can do to help prevent falls. This information is not intended to replace advice given to you by your health care provider. Make sure you discuss any questions you have with your health care provider. Document Released: 02/11/2009 Document Revised: 09/23/2015 Document Reviewed: 05/22/2014 Elsevier Interactive Patient Education  2017 [employer].

## 2022-08-30 ENCOUNTER — Ambulatory Visit (INDEPENDENT_AMBULATORY_CARE_PROVIDER_SITE_OTHER): Payer: Medicare HMO | Admitting: Internal Medicine

## 2022-08-30 ENCOUNTER — Encounter: Payer: Self-pay | Admitting: Internal Medicine

## 2022-08-30 ENCOUNTER — Ambulatory Visit (INDEPENDENT_AMBULATORY_CARE_PROVIDER_SITE_OTHER): Payer: Medicare HMO

## 2022-08-30 VITALS — BP 130/86 | HR 60 | Temp 98.3°F | Ht 64.0 in

## 2022-08-30 DIAGNOSIS — R636 Underweight: Secondary | ICD-10-CM

## 2022-08-30 DIAGNOSIS — M79604 Pain in right leg: Secondary | ICD-10-CM | POA: Diagnosis not present

## 2022-08-30 DIAGNOSIS — M79651 Pain in right thigh: Secondary | ICD-10-CM | POA: Diagnosis not present

## 2022-08-30 DIAGNOSIS — R202 Paresthesia of skin: Secondary | ICD-10-CM | POA: Diagnosis not present

## 2022-08-30 DIAGNOSIS — F17200 Nicotine dependence, unspecified, uncomplicated: Secondary | ICD-10-CM | POA: Diagnosis not present

## 2022-08-30 LAB — COMPREHENSIVE METABOLIC PANEL
ALT: 70 U/L — ABNORMAL HIGH (ref 0–35)
AST: 81 U/L — ABNORMAL HIGH (ref 0–37)
Albumin: 3.6 g/dL (ref 3.5–5.2)
Alkaline Phosphatase: 37 U/L — ABNORMAL LOW (ref 39–117)
BUN: 18 mg/dL (ref 6–23)
CO2: 30 mEq/L (ref 19–32)
Calcium: 9.2 mg/dL (ref 8.4–10.5)
Chloride: 101 mEq/L (ref 96–112)
Creatinine, Ser: 0.77 mg/dL (ref 0.40–1.20)
GFR: 73.92 mL/min (ref >60.00)
Glucose, Bld: 86 mg/dL (ref 70–99)
Potassium: 4 mEq/L (ref 3.5–5.1)
Sodium: 141 mEq/L (ref 135–145)
Total Bilirubin: 0.4 mg/dL (ref 0.2–1.2)
Total Protein: 7 g/dL (ref 6.0–8.3)

## 2022-08-30 LAB — CBC WITH DIFFERENTIAL/PLATELET
Basophils Absolute: 0 10*3/uL (ref 0.0–0.1)
Basophils Relative: 0.3 % (ref 0.0–3.0)
Eosinophils Absolute: 0 10*3/uL (ref 0.0–0.7)
Eosinophils Relative: 0.7 % (ref 0.0–5.0)
HCT: 44 % (ref 36.0–46.0)
Hemoglobin: 14.7 g/dL (ref 12.0–15.0)
Lymphocytes Relative: 53.2 % — ABNORMAL HIGH (ref 12.0–46.0)
Lymphs Abs: 2.7 10*3/uL (ref 0.7–4.0)
MCHC: 33.4 g/dL (ref 30.0–36.0)
MCV: 96.6 fl (ref 78.0–100.0)
Monocytes Absolute: 0.5 10*3/uL (ref 0.1–1.0)
Monocytes Relative: 10.1 % (ref 3.0–12.0)
Neutro Abs: 1.8 10*3/uL (ref 1.4–7.7)
Neutrophils Relative %: 35.7 % — ABNORMAL LOW (ref 43.0–77.0)
Platelets: 239 10*3/uL (ref 150.0–400.0)
RBC: 4.55 Mil/uL (ref 3.87–5.11)
RDW: 13.7 % (ref 11.5–15.5)
WBC: 5.1 10*3/uL (ref 4.0–10.5)

## 2022-08-30 LAB — CK: Total CK: 28 U/L (ref 7–177)

## 2022-08-30 LAB — SEDIMENTATION RATE: Sed Rate: 12 mm/hr (ref 0–30)

## 2022-08-30 LAB — VITAMIN B12: Vitamin B-12: >1500 pg/mL — ABNORMAL HIGH (ref 211–911)

## 2022-08-30 MED ORDER — TRAMADOL HCL 50 MG PO TABS: 50.0000 mg | ORAL_TABLET | Freq: Four times a day (QID) | ORAL | 0 refills | Status: AC | PRN

## 2022-08-30 MED ORDER — METHYLPREDNISOLONE 4 MG PO TBPK: ORAL_TABLET | ORAL | 0 refills | Status: DC

## 2022-08-30 MED ORDER — ASPIRIN 81 MG PO TBEC: 81.0000 mg | DELAYED_RELEASE_TABLET | Freq: Two times a day (BID) | ORAL | 3 refills | Status: AC

## 2022-08-30 NOTE — Assessment & Plan Note (Signed)
Check vitamin levels 

## 2022-08-30 NOTE — Progress Notes (Signed)
Subjective:  Patient ID: Diana Barker, female    DOB: February 02, 1946  Age: 77 y.o. MRN: [id number]  CC: Leg Pain (RT THIGH PAIN)   HPI Diana Barker presents for R LE "sciatica pain" per pt x 3 wks - it is located in the front of the thigh. It is 5/10 now, sometimes the pain is 10/10. Worse w/walking... No LBP. No buttock pain  Outpatient Medications Prior to Visit  Medication Sig Dispense Refill   atenolol-chlorthalidone (TENORETIC) 50-25 MG tablet Take 1 tablet by mouth daily. Patient needs office visit before refills will be given 90 tablet 3   calcium-vitamin D (OSCAL WITH D) 500-200 MG-UNIT tablet Take 1 tablet by mouth daily.     Cholecalciferol (VITAMIN D3) 50 MCG (2000 UT) capsule Take 1 capsule (2,000 Units total) by mouth daily. 100 capsule 3   Cyanocobalamin (VITAMIN B-12) 1000 MCG SUBL Place 1 tablet (1,000 mcg total) under the tongue daily. 100 tablet 3   aspirin 81 MG tablet Take 81 mg by mouth daily.       predniSONE (STERAPRED UNI-PAK 21 TAB) 10 MG (21) TBPK tablet Take by mouth daily. Take 6 tabs by mouth daily  for 2 days, then 5 tabs for 2 days, then 4 tabs for 2 days, then 3 tabs for 2 days, 2 tabs for 2 days, then 1 tab by mouth daily for 2 days 42 tablet 0   No facility-administered medications prior to visit.    ROS: Review of Systems  Constitutional:  Negative for activity change, appetite change, chills, fatigue and unexpected weight change.  HENT:  Negative for congestion, mouth sores and sinus pressure.   Eyes:  Negative for visual disturbance.  Respiratory:  Negative for cough and chest tightness.   Gastrointestinal:  Negative for abdominal pain and nausea.  Genitourinary:  Negative for difficulty urinating, frequency and vaginal pain.  Musculoskeletal:  Positive for arthralgias and gait problem. Negative for back pain.  Skin:  Negative for pallor and rash.  Neurological:  Negative for dizziness, tremors, weakness, numbness and headaches.   Psychiatric/Behavioral:  Negative for confusion and sleep disturbance.     Objective:  BP 130/86 (BP Location: Left Arm, Patient Position: Sitting, Cuff Size: Large)   Pulse 60   Temp 98.3 F (36.8 C) (Oral)   Ht 5\' 4"  (1.626 m)   SpO2 98%   BMI 15.45 kg/m   BP Readings from Last 3 Encounters:  08/30/22 130/86  07/16/22 (!) 160/96  12/19/21 (!) 148/84    Wt Readings from Last 3 Encounters:  08/29/22 90 lb (40.8 kg)  12/19/21 90 lb 6.4 oz (41 kg)  10/19/21 94 lb (42.6 kg)    Physical Exam Constitutional:      General: She is not in acute distress.    Appearance: Normal appearance. She is well-developed. She is not toxic-appearing.  HENT:     Head: Normocephalic.     Right Ear: External ear normal.     Left Ear: External ear normal.     Nose: Nose normal.  Eyes:     General:        Right eye: No discharge.        Left eye: No discharge.     Conjunctiva/sclera: Conjunctivae normal.     Pupils: Pupils are equal, round, and reactive to light.  Neck:     Thyroid: No thyromegaly.     Vascular: No JVD.     Trachea: No tracheal deviation.  Cardiovascular:  Rate and Rhythm: Normal rate and regular rhythm.     Heart sounds: Normal heart sounds.  Pulmonary:     Effort: No respiratory distress.     Breath sounds: No stridor. No wheezing.  Abdominal:     General: Bowel sounds are normal. There is no distension.     Palpations: Abdomen is soft. There is no mass.     Tenderness: There is no abdominal tenderness. There is no guarding or rebound.  Musculoskeletal:        General: No tenderness.     Cervical back: Normal range of motion and neck supple. No rigidity.  Lymphadenopathy:     Cervical: No cervical adenopathy.  Skin:    Findings: No erythema or rash.  Neurological:     Cranial Nerves: No cranial nerve deficit.     Motor: No abnormal muscle tone.     Coordination: Coordination normal.     Deep Tendon Reflexes: Reflexes normal.  Psychiatric:         Behavior: Behavior normal.        Thought Content: Thought content normal.        Judgment: Judgment normal.   Thin Str leg elev (-) B Hips, LS - WNL  Lab Results  Component Value Date   WBC 5.0 10/19/2021   HGB 13.9 10/19/2021   HCT 43.1 10/19/2021   PLT 172.0 10/19/2021   GLUCOSE 89 10/19/2021   CHOL 162 10/19/2021   TRIG 77.0 10/19/2021   HDL 89.90 10/19/2021   LDLDIRECT 101.6 12/18/2008   LDLCALC 57 10/19/2021   ALT 65 (H) 10/19/2021   AST 54 (H) 10/19/2021   NA 139 10/19/2021   K 3.6 10/19/2021   CL 101 10/19/2021   CREATININE 0.99 10/19/2021   BUN 19 10/19/2021   CO2 32 10/19/2021   TSH 0.96 10/19/2021   INR 0.9 ratio 12/18/2008    No results found.  Assessment & Plan:   Problem List Items Addressed This Visit     Tobacco use disorder    1/2 ppd smoker Chronic  Discussed      Underweight    Check vitamin levels      Leg pain, anterior, right - Primary    ?etiology ?PAD vs other R femur X ray Arterial doppler US Empiric Medrol pack Increase ASA to bid      Relevant Orders   XR FEMUR, MIN 2 VIEWS RIGHT   VAS [place] LOWER EXT ART SEG MULTI (SEGMENTALS & LE RAYNAUDS)   CBC with Differential/Platelet   Comprehensive metabolic panel   CK   Sedimentation rate   Other Visit Diagnoses     Paresthesia       Relevant Orders   Vitamin B12         Meds ordered this encounter  Medications   methylPREDNISolone (MEDROL DOSEPAK) 4 MG TBPK tablet    Sig: As directed    Dispense:  21 tablet    Refill:  0   aspirin EC 81 MG tablet    Sig: Take 1 tablet (81 mg total) by mouth 2 (two) times daily.    Dispense:  100 tablet    Refill:  3   traMADol (ULTRAM) 50 MG tablet    Sig: Take 1 tablet (50 mg total) by mouth every 6 (six) hours as needed for up to 5 days.    Dispense:  20 tablet    Refill:  0      Follow-up: No follow-ups on file.  Alex Ronald Londo,  MD

## 2022-08-30 NOTE — Assessment & Plan Note (Signed)
?  etiology ?PAD vs other R femur X ray Arterial doppler US Empiric Medrol pack Increase ASA to bid

## 2022-08-30 NOTE — Assessment & Plan Note (Signed)
1/2 ppd smoker Chronic  Discussed

## 2022-09-20 ENCOUNTER — Encounter (HOSPITAL_COMMUNITY): Payer: Medicare HMO

## 2022-09-21 ENCOUNTER — Ambulatory Visit (INDEPENDENT_AMBULATORY_CARE_PROVIDER_SITE_OTHER): Payer: Medicare HMO | Admitting: Family Medicine

## 2022-09-21 ENCOUNTER — Other Ambulatory Visit: Payer: Self-pay | Admitting: Internal Medicine

## 2022-09-21 ENCOUNTER — Ambulatory Visit (HOSPITAL_COMMUNITY)
Admission: RE | Admit: 2022-09-21 | Discharge: 2022-09-21 | Disposition: A | Discharge status: Home / Self Care | Payer: Medicare HMO | Source: Ambulatory Visit | Attending: Cardiology | Admitting: Cardiology

## 2022-09-21 ENCOUNTER — Encounter: Payer: Self-pay | Admitting: Family Medicine

## 2022-09-21 ENCOUNTER — Other Ambulatory Visit: Payer: Self-pay | Admitting: Family Medicine

## 2022-09-21 ENCOUNTER — Ambulatory Visit (INDEPENDENT_AMBULATORY_CARE_PROVIDER_SITE_OTHER): Payer: Medicare HMO

## 2022-09-21 VITALS — BP 130/84 | HR 64 | Temp 97.7°F | Ht 64.0 in | Wt 95.4 lb

## 2022-09-21 DIAGNOSIS — M79604 Pain in right leg: Secondary | ICD-10-CM | POA: Insufficient documentation

## 2022-09-21 DIAGNOSIS — M25561 Pain in right knee: Secondary | ICD-10-CM

## 2022-09-21 DIAGNOSIS — R636 Underweight: Secondary | ICD-10-CM

## 2022-09-21 DIAGNOSIS — F17200 Nicotine dependence, unspecified, uncomplicated: Secondary | ICD-10-CM

## 2022-09-21 DIAGNOSIS — R202 Paresthesia of skin: Secondary | ICD-10-CM

## 2022-09-21 DIAGNOSIS — M87051 Idiopathic aseptic necrosis of right femur: Secondary | ICD-10-CM

## 2022-09-21 LAB — VAS US LOWER EXT ART SEG MULTI (SEGMENTALS & LE RAYNAUDS)
Left ABI: 1.05
Right ABI: 1.09

## 2022-09-21 MED ORDER — TRAMADOL HCL 50 MG PO TABS
50.0000 mg | ORAL_TABLET | Freq: Three times a day (TID) | ORAL | 0 refills | Status: AC | PRN
Start: 2022-09-21 — End: 2022-09-26

## 2022-09-21 MED ORDER — MELOXICAM 7.5 MG PO TABS
7.5000 mg | ORAL_TABLET | Freq: Every day | ORAL | 0 refills | Status: AC
Start: 2022-09-21 — End: ?

## 2022-09-21 MED ORDER — DICLOFENAC SODIUM 1 % EX GEL
4.0000 g | Freq: Four times a day (QID) | CUTANEOUS | 0 refills | Status: AC
Start: 2022-09-21 — End: ?

## 2022-09-21 NOTE — Progress Notes (Signed)
Subjective:     Patient ID: [name], female    DOB: 20-Nov-1945, 77 y.o.   MRN: [id number]  Chief Complaint  Patient presents with   Pain    Right upper thigh to knee pain, been going for a month now, saw PCP for it was given tramadol but it not helping     HPI Patient is in today for right knee pain x 4 wks. Denies injury or hx of knee pain. Pain improved in upper thigh.    States she is having trouble walking. Denies locking, popping or giving away of her knee.   States she needs more pain medication.   Health Maintenance Due  Topic Date Due   Pneumonia Vaccine 9+ Years old (1 of 2 - PCV) Never done   Hepatitis C Screening  Never done   DTaP/Tdap/Td (1 - Tdap) Never done   Lung Cancer Screening  09/09/2015   Colonoscopy  10/16/2018   COVID-19 Vaccine (5 - 2023-24 season) 12/30/2021    Past Medical History:  Diagnosis Date   Alcoholism [facility])    Colon cancer (HCC) 2010   Dr. Myna Hidalgo   Colon polyp 10/15/2013   Tubular adenoma   Esophageal stricture    HTN (hypertension)    Liver cyst    Pancreas cyst 2010    Past Surgical History:  Procedure Laterality Date   COLECTOMY  2010   hemi/Dr Fleeta Emmer   DENTAL SURGERY  2015   extractions   PORTACATH PLACEMENT  2010   portacath removal  2011    Family History  Adopted: Yes  Problem Relation Age of Onset   Hypertension Other    Colon cancer Neg Hx     Social History   Socioeconomic History   Marital status: Single    Spouse name: Not on file   Number of children: Not on file   Years of education: Not on file   Highest education level: Not on file  Occupational History   Occupation: Dietary Aid  Tobacco Use   Smoking status: Every Day    Packs/day: 0.50    Years: 50.00    Additional pack years: 0.00    Total pack years: 25.00    Types: Cigarettes   Smokeless tobacco: Never  Vaping Use   Vaping Use: Never used  Substance and Sexual Activity   Alcohol use: Yes    Alcohol/week: 28.0 standard  drinks of alcohol    Types: 28 Cans of beer per week   Drug use: No   Sexual activity: Not Currently  Other Topics Concern   Not on file  Social History Narrative   Not on file   Social Determinants of Health   Financial Resource Strain: Low Risk  (08/29/2022)   Overall Financial Resource Strain (CARDIA)    Difficulty of Paying Living Expenses: Not hard at all  Food Insecurity: No Food Insecurity (08/29/2022)   Hunger Vital Sign    Worried About Running Out of Food in the Last Year: Never true    Ran Out of Food in the Last Year: Never true  Transportation Needs: No Transportation Needs (08/29/2022)   PRAPARE - Administrator, Civil Service (Medical): No    Lack of Transportation (Non-Medical): No  Physical Activity: Inactive (08/29/2022)   Exercise Vital Sign    Days of Exercise per Week: 0 days    Minutes of Exercise per Session: 0 min  Stress: No Stress Concern Present (08/29/2022)   [employer]  of Occupational Health - Occupational Stress Questionnaire    Feeling of Stress : Not at all  Social Connections: Moderately Isolated (08/29/2022)   Social Connection and Isolation Panel [NHANES]    Frequency of Communication with Friends and Family: Twice a week    Frequency of Social Gatherings with Friends and Family: Twice a week    Attends Religious Services: 1 to 4 times per year    Active Member of [employer] or Organizations: No    Attends Banker Meetings: Never    Marital Status: Widowed  Intimate Partner Violence: Not At Risk (08/29/2022)   Humiliation, Afraid, Rape, and Kick questionnaire    Fear of Current or Ex-Partner: No    Emotionally Abused: No    Physically Abused: No    Sexually Abused: No    Outpatient Medications Prior to Visit  Medication Sig Dispense Refill   aspirin EC 81 MG tablet Take 1 tablet (81 mg total) by mouth 2 (two) times daily. 100 tablet 3   atenolol-chlorthalidone (TENORETIC) 50-25 MG tablet Take 1 tablet by mouth  daily. Patient needs office visit before refills will be given 90 tablet 3   calcium-vitamin D (OSCAL WITH D) 500-200 MG-UNIT tablet Take 1 tablet by mouth daily.     Cholecalciferol (VITAMIN D3) 50 MCG (2000 UT) capsule Take 1 capsule (2,000 Units total) by mouth daily. 100 capsule 3   Cyanocobalamin (VITAMIN B-12) 1000 MCG SUBL Place 1 tablet (1,000 mcg total) under the tongue daily. 100 tablet 3   methylPREDNISolone (MEDROL DOSEPAK) 4 MG TBPK tablet As directed 21 tablet 0   No facility-administered medications prior to visit.    Allergies  Allergen Reactions   Megace [Megestrol]     itching    Review of Systems  Constitutional:  Negative for chills and fever.  Respiratory:  Negative for shortness of breath.   Cardiovascular:  Negative for chest pain, palpitations and leg swelling.  Gastrointestinal:  Negative for nausea and vomiting.  Musculoskeletal:  Positive for joint pain. Negative for falls.  Neurological:  Negative for dizziness, sensory change and focal weakness.       Objective:    Physical Exam Constitutional:      General: She is not in acute distress.    Appearance: She is not ill-appearing.  Eyes:     Extraocular Movements: Extraocular movements intact.     Conjunctiva/sclera: Conjunctivae normal.  Cardiovascular:     Rate and Rhythm: Normal rate.  Pulmonary:     Effort: Pulmonary effort is normal.  Musculoskeletal:        General: No swelling or deformity.     Cervical back: Normal range of motion and neck supple.     Right knee: No swelling, deformity, effusion, bony tenderness or crepitus. No tenderness. Normal patellar mobility. Normal pulse.     Right lower leg: No edema.     Left lower leg: No edema.  Skin:    General: Skin is warm and dry.  Neurological:     General: No focal deficit present.     Mental Status: She is alert and oriented to person, place, and time.     Cranial Nerves: No cranial nerve deficit.     Sensory: No sensory deficit.      Motor: No weakness.     Coordination: Coordination normal.     Gait: Gait abnormal.  Psychiatric:        Mood and Affect: Mood normal.        Behavior:  Behavior normal.        Thought Content: Thought content normal.     BP 130/84   Pulse 64   Temp 97.7 F (36.5 C) (Temporal)   Ht 5\' 4"  (1.626 m)   Wt 95 lb 6 oz (43.3 kg)   SpO2 97%   BMI 16.37 kg/m  Wt Readings from Last 3 Encounters:  09/21/22 95 lb 6 oz (43.3 kg)  08/29/22 90 lb (40.8 kg)  12/19/21 90 lb 6.4 oz (41 kg)       Assessment & Plan:   Problem List Items Addressed This Visit   None Visit Diagnoses     Acute pain of right knee    -  Primary   Relevant Medications   traMADol (ULTRAM) 50 MG tablet   meloxicam (MOBIC) 7.5 MG tablet   diclofenac Sodium (VOLTAREN) 1 % GEL   Other Relevant Orders   DG Knee Complete 4 Views Right (Completed)      Reviewed notes from PCP regarding R upper leg pain. Reports improvement in symptoms.  Now has right knee pain. No red flag symptoms.  Exam is benign.  X ray ordered.  Discussed conservative management. Refilled Tramadol. Prescribed meloxicam and Voltaren gel.  referral to sports medicine.   I have discontinued Elease Hashimoto A. Quinonez's methylPREDNISolone. I am also having her start on traMADol, meloxicam, and diclofenac Sodium. Additionally, I am having her maintain her Vitamin B-12, calcium-vitamin D, Vitamin D3, atenolol-chlorthalidone, and aspirin EC.  Meds ordered this encounter  Medications   traMADol (ULTRAM) 50 MG tablet    Sig: Take 1 tablet (50 mg total) by mouth every 8 (eight) hours as needed for up to 5 days.    Dispense:  15 tablet    Refill:  0    Order Specific Question:   Supervising Provider    Answer:   Hillard Danker A [[id number]]   meloxicam (MOBIC) 7.5 MG tablet    Sig: Take 1 tablet (7.5 mg total) by mouth daily.    Dispense:  30 tablet    Refill:  0    Order Specific Question:   Supervising Provider    Answer:   Hillard Danker A  [[id number]]   diclofenac Sodium (VOLTAREN) 1 % GEL    Sig: Apply 4 g topically 4 (four) times daily.    Dispense:  150 g    Refill:  0    Order Specific Question:   Supervising Provider    Answer:   Hillard Danker A [[id number]]

## 2022-09-21 NOTE — Patient Instructions (Signed)
Please go downstairs for an X ray or your knee.   Start meloxicam prescription with food as prescribed.   Use Voltaren gel on your knee up to 4 times daily.   Take the Tramadol as needed.   Use an ice pack on your knee and elevate it when sitting.   We will be in touch with your X ray result.

## 2022-10-02 NOTE — Progress Notes (Unsigned)
   Rubin Payor, PhD, LAT, ATC acting as a scribe for Clementeen Graham, MD.  Diana Barker is a 77 y.o. female who presents to [facility] today for R knee pain ongoing for about 5 wks. She was seen at [facility] on 3/17 for R leg pain and was given a course of prednisone. PCP ordered an arterial doppler US.   Today, pt reports chronic right knee pain. Pt locates pain to medial aspect of the knee. Also having pain proximal to patella/lower thigh. Denies mechanical sx, ambulating with a cane today. Had some swelling initially but that has improved. Denies pain in the lower leg. Sx worse with ambulation. Sx causing abnormal gait. Has gotten the most relief with Meloxicam and Tramadol. Denies swelling in the lower leg.   R Knee swelling: intermittent  Mechanical symptoms: no Radiates: upper leg Aggravates: ambulation Treatments tried: prednisone, oral diclofenac, meloxicam, tramadol  Dx testing: 09/21/22 R knee XR & LE arterial doppler US 08/30/22 R femur XR  Pertinent review of systems: No fevers or chills  Relevant historical information: Hypertension.  Smoker.   Exam:  BP 120/82   Pulse 82   Ht 5\' 4"  (1.626 m)   Wt 98 lb 8 oz (44.7 kg)   SpO2 98%   BMI 16.91 kg/m  General: Well appearing thin woman in no acute distress. MSK: Right knee normal-appearing nontender normal motion.  Intact strength.    Lab and Radiology Results  EXAM: RIGHT KNEE - COMPLETE 4+ VIEW   COMPARISON:  08/30/2022   FINDINGS: No significant joint effusion. No fracture or dislocation identified. No significant arthropathy. 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   IMPRESSION: 1. No acute findings. 2. Stable sclerosis within the distal diaphysis of the right femur favored to represent sequelae of remote bone infarcts.     Electronically Signed   By:  Signa Kell M.D.   On: 09/21/2022 12:46 I, Clementeen Graham, personally (independently) visualized and performed the interpretation of the images attached in this note.      Assessment and Plan: 77 y.o. female with right knee pain now resolved with meloxicam.  She does not have a lot of arthritis seen on x-ray however there may be more degenerative changes and is visible on x-ray.  She may have had an exacerbation of arthritis or degenerative irritated meniscus.  Regardless she is feeling better now with a little bit of meloxicam.  She no longer is taking meloxicam.  I think it is okay to proceed with some watchful waiting.  Recommend avoiding meloxicam if possible and using Tylenol arthritis and Voltaren gel.  If her pain comes back again next step may be steroid injection.  We may also want to do an MRI of her knee to evaluate her pain more accurately and potentially to evaluate the possible bone infarct seen on x-ray.  For now watchful waiting.    PDMP not reviewed this encounter. No orders of the defined types were placed in this encounter.  No orders of the defined types were placed in this encounter.    Discussed warning signs or symptoms. Please see discharge instructions. Patient expresses understanding.   The above documentation has been reviewed and is accurate and complete Clementeen Graham, M.D.

## 2022-10-03 ENCOUNTER — Encounter: Payer: Self-pay | Admitting: Family Medicine

## 2022-10-03 ENCOUNTER — Ambulatory Visit: Payer: Medicare HMO | Admitting: Family Medicine

## 2022-10-03 ENCOUNTER — Other Ambulatory Visit: Payer: Self-pay

## 2022-10-03 VITALS — BP 120/82 | HR 82 | Ht 64.0 in | Wt 98.5 lb

## 2022-10-03 DIAGNOSIS — M1711 Unilateral primary osteoarthritis, right knee: Secondary | ICD-10-CM

## 2022-10-03 DIAGNOSIS — M25561 Pain in right knee: Secondary | ICD-10-CM | POA: Diagnosis not present

## 2022-10-03 DIAGNOSIS — G8929 Other chronic pain: Secondary | ICD-10-CM | POA: Diagnosis not present

## 2022-10-03 NOTE — Patient Instructions (Signed)
Thank you for coming in today.   Use meloxicam sparingly.   Tylenol is safer. Voltaren gel is safer.   If not better we can do an injection and MRI.   Let me know this misbehaves.

## 2023-02-26 ENCOUNTER — Telehealth: Payer: Self-pay | Admitting: Internal Medicine

## 2023-02-26 NOTE — Telephone Encounter (Signed)
Pt states that she need her prescription sent to a different pharmacy. New pharmacy is [facility] [address] [place]. Medication is atenolol-chlorthalidone (TENORETIC) 50-25 MG tablet

## 2023-02-27 ENCOUNTER — Other Ambulatory Visit: Payer: Self-pay

## 2023-02-27 MED ORDER — ATENOLOL-CHLORTHALIDONE 50-25 MG PO TABS: 1.0000 | ORAL_TABLET | Freq: Every day | ORAL | 0 refills | Status: DC

## 2023-03-01 ENCOUNTER — Other Ambulatory Visit: Payer: Self-pay | Admitting: Internal Medicine

## 2023-03-01 ENCOUNTER — Telehealth: Payer: Self-pay | Admitting: Internal Medicine

## 2023-03-01 NOTE — Telephone Encounter (Signed)
Patient was supposed to pick up her atenolol-chlorthalidone (TENORETIC) 50-25 MG tablet from the pharmacy and they told her it was already picked up. She said she didn't get it and nobody from her family got it. Please advise. Patient would like a call back at [phone].

## 2023-03-05 NOTE — Telephone Encounter (Signed)
Okay to renew the medication and allow the pharmacy to have the patient pick it up.  Thank you

## 2023-03-08 ENCOUNTER — Other Ambulatory Visit: Payer: Self-pay

## 2023-03-08 MED ORDER — ATENOLOL-CHLORTHALIDONE 50-25 MG PO TABS: 1.0000 | ORAL_TABLET | Freq: Every day | ORAL | 0 refills | Status: DC

## 2023-03-08 NOTE — Telephone Encounter (Signed)
Medication has been sent in 

## 2023-03-13 ENCOUNTER — Ambulatory Visit: Payer: Medicare HMO | Admitting: Internal Medicine

## 2023-10-02 ENCOUNTER — Ambulatory Visit

## 2023-10-02 VITALS — Ht 64.0 in | Wt 98.0 lb

## 2023-10-02 DIAGNOSIS — F17200 Nicotine dependence, unspecified, uncomplicated: Secondary | ICD-10-CM

## 2023-10-02 DIAGNOSIS — Z0100 Encounter for examination of eyes and vision without abnormal findings: Secondary | ICD-10-CM

## 2023-10-02 DIAGNOSIS — Z1159 Encounter for screening for other viral diseases: Secondary | ICD-10-CM

## 2023-10-02 DIAGNOSIS — Z122 Encounter for screening for malignant neoplasm of respiratory organs: Secondary | ICD-10-CM

## 2023-10-02 NOTE — Patient Instructions (Signed)
 Ms. Cliburn , Thank you for taking time out of your busy schedule to complete your Annual Wellness Visit with me. I enjoyed our conversation and look forward to speaking with you again next year. I, as well as your care team,  appreciate your ongoing commitment to your health goals. Please review the following plan we discussed and let me know if I can assist you in the future. Your Game plan/ To Do List    Referrals: If you haven't heard from the office you've been referred to, please reach out to them at the phone provided.  Referral to Dr Stewart Elk w/[facility] Follow up Visits: Next Medicare AWV with our clinical staff: 10/03/2024   Have you seen your provider in the last 6 months (3 months if uncontrolled diabetes)? No Next Office Visit with your provider: 10/24/2023 - physical at 8:30am  Clinician Recommendations:  Aim for 30 minutes of exercise or brisk walking, 6-8 glasses of water, and 5 servings of fruits and vegetables each day. Educated and advised on getting the Pneumonia, Tdap (Tetenus), and COVID vaccines at local pharmacy in 2025.      This is a list of the screening recommended for you and due dates:  Health Maintenance  Topic Date Due   Hepatitis C Screening  Never done   DTaP/Tdap/Td vaccine (1 - Tdap) Never done   Pneumonia Vaccine (1 of 2 - PCV) Never done   Screening for Lung Cancer  09/09/2015   Colon Cancer Screening  10/16/2018   COVID-19 Vaccine (5 - 2024-25 season) 12/31/2022   Medicare Annual Wellness Visit  08/29/2023   DEXA scan (bone density measurement)  Completed   HPV Vaccine  Aged Out   Meningitis B Vaccine  Aged Out   Flu Shot  Discontinued   Zoster (Shingles) Vaccine  Discontinued    Advanced directives: (Declined) Advance directive discussed with you today. Even though you declined this today, please call our office should you change your mind, and we can give you the proper paperwork for you to fill out. Advance Care Planning is important  because it:  [x]  Makes sure you receive the medical care that is consistent with your values, goals, and preferences  [x]  It provides guidance to your family and loved ones and reduces their decisional burden about whether or not they are making the right decisions based on your wishes.  Follow the link provided in your after visit summary or read over the paperwork we have mailed to you to help you started getting your Advance Directives in place. If you need assistance in completing these, please reach out to us  so that we can help you!

## 2023-10-02 NOTE — Progress Notes (Addendum)
 Subjective:   Diana Barker is a 78 y.o. who presents for a Medicare Wellness preventive visit.  As a reminder, Annual Wellness Visits don't include a physical exam, and some assessments may be limited, especially if this visit is performed virtually. We may recommend an in-person follow-up visit with your provider if needed.  Visit Complete: Virtual I connected with  [name] on 10/02/23 by a audio enabled telemedicine application and verified that I am speaking with the correct person using two identifiers.  Patient Location: Home  Provider Location: Office/Clinic  I discussed the limitations of evaluation and management by telemedicine. The patient expressed understanding and agreed to proceed.  Vital Signs: Because this visit was a virtual/telehealth visit, some criteria may be missing or patient reported. Any vitals not documented were not able to be obtained and vitals that have been documented are patient reported.  VideoDeclined- This patient declined Librarian, academic. Therefore the visit was completed with audio only.  Persons Participating in Visit: Patient.  AWV Questionnaire: No: Patient Medicare AWV questionnaire was not completed prior to this visit.  Cardiac Risk Factors include: advanced age (>13men, >70 women);hypertension;smoking/ tobacco exposure     Objective:     Today's Vitals   10/02/23 1240  Weight: 98 lb (44.5 kg)  Height: 5\' 4"  (1.626 m)   Body mass index is 16.82 kg/m.     10/02/2023   12:40 PM 08/29/2022    8:52 AM 09/30/2021    3:33 PM 06/23/2021    7:37 AM 02/10/2021    9:47 AM 12/04/2019    2:50 PM 07/16/2019   11:23 AM  Advanced Directives  Does Patient Have a Medical Advance Directive? No No No No No Yes No  Does patient want to make changes to medical advance directive?      No - Patient declined   Would patient like information on creating a medical advance directive? No - Patient declined No -  Patient declined No - Patient declined No - Patient declined   No - Patient declined    Current Medications (verified) Outpatient Encounter Medications as of 10/02/2023  Medication Sig   atenolol -chlorthalidone  (TENORETIC ) 50-25 MG tablet Take 1 tablet by mouth daily. Patient needs office visit before refills will be given   calcium-vitamin D  (OSCAL WITH D) 500-200 MG-UNIT tablet Take 1 tablet by mouth daily.   Cholecalciferol (VITAMIN D3) 50 MCG (2000 UT) capsule Take 1 capsule (2,000 Units total) by mouth daily.   Cyanocobalamin  (VITAMIN B-12) 1000 MCG SUBL Place 1 tablet (1,000 mcg total) under the tongue daily.   diclofenac  Sodium (VOLTAREN ) 1 % GEL Apply 4 g topically 4 (four) times daily.   meloxicam  (MOBIC ) 7.5 MG tablet Take 1 tablet (7.5 mg total) by mouth daily.   No facility-administered encounter medications on file as of 10/02/2023.    Allergies (verified) Megace  [megestrol ]   History: Past Medical History:  Diagnosis Date   Alcoholism (HCC)    Colon cancer (HCC) 2010   Dr. Maria Shiner   Colon polyp 10/15/2013   Tubular adenoma   Esophageal stricture    HTN (hypertension)    Liver cyst    Pancreas cyst 2010   Past Surgical History:  Procedure Laterality Date   COLECTOMY  2010   hemi/Dr Lyla Samuels   DENTAL SURGERY  2015   extractions   PORTACATH PLACEMENT  2010   portacath removal  2011   Family History  Adopted: Yes  Problem Relation Age of Onset  Hypertension Other    Colon cancer Neg Hx    Social History   Socioeconomic History   Marital status: Single    Spouse name: Not on file   Number of children: Not on file   Years of education: Not on file   Highest education level: Not on file  Occupational History   Occupation: Dietary Aid  Tobacco Use   Smoking status: Every Day    Current packs/day: 0.50    Average packs/day: 0.5 packs/day for 50.0 years (25.0 ttl pk-yrs)    Types: Cigarettes   Smokeless tobacco: Never  Vaping Use   Vaping status: Never Used   Substance and Sexual Activity   Alcohol use: Yes    Alcohol/week: 28.0 standard drinks of alcohol    Types: 28 Cans of beer per week   Drug use: No   Sexual activity: Not Currently  Other Topics Concern   Not on file  Social History Narrative   Single   Pt not ready to quit smoking   Social Drivers of Health   Financial Resource Strain: Low Risk  (10/02/2023)   Overall Financial Resource Strain (CARDIA)    Difficulty of Paying Living Expenses: Not hard at all  Food Insecurity: No Food Insecurity (10/02/2023)   Hunger Vital Sign    Worried About Running Out of Food in the Last Year: Never true    Ran Out of Food in the Last Year: Never true  Transportation Needs: No Transportation Needs (10/02/2023)   PRAPARE - Administrator, Civil Service (Medical): No    Lack of Transportation (Non-Medical): No  Physical Activity: Sufficiently Active (10/02/2023)   Exercise Vital Sign    Days of Exercise per Week: 5 days    Minutes of Exercise per Session: 60 min  Stress: No Stress Concern Present (10/02/2023)   [employer] of Occupational Health - Occupational Stress Questionnaire    Feeling of Stress : Not at all  Social Connections: Moderately Isolated (10/02/2023)   Social Connection and Isolation Panel [NHANES]    Frequency of Communication with Friends and Family: More than three times a week    Frequency of Social Gatherings with Friends and Family: Twice a week    Attends Religious Services: 1 to 4 times per year    Active Member of [employer] or Organizations: No    Attends Engineer, structural: Never    Marital Status: Never married    Tobacco Counseling Ready to quit: No Counseling given: Yes    Clinical Intake:  Pre-visit preparation completed: Yes  Pain : No/denies pain     BMI - recorded: 16.82 Nutritional Status: BMI <19  Underweight Nutritional Risks: None Diabetes: No  No results found for: "HGBA1C"   How often do you need to have someone  help you when you read instructions, pamphlets, or other written materials from your doctor or pharmacy?: 1 - Never  Interpreter Needed?: No  Information entered by :: Kandy Orris, CMA   Activities of Daily Living     10/02/2023   12:44 PM  In your present state of health, do you have any difficulty performing the following activities:  Hearing? 0  Vision? 0  Difficulty concentrating or making decisions? 0  Walking or climbing stairs? 0  Dressing or bathing? 0  Doing errands, shopping? 0  Preparing Food and eating ? N  Using the Toilet? N  In the past six months, have you accidently leaked urine? N  Do you have  problems with loss of bowel control? N  Managing your Medications? N  Managing your Finances? N  Housekeeping or managing your Housekeeping? N    Patient Care Team: Plotnikov, Oakley Bellman, MD as PCP - Darius Edouard, MD as Consulting Physician (Orthopedic Surgery) Tobin Forts, MD as Consulting Physician (Gastroenterology) Maria Shiner Sherryll Donald, MD as Consulting Physician (Oncology)  I have updated your Care Teams any recent Medical Services you may have received from other providers in the past year.     Assessment:    This is a routine wellness examination for Sharlena.  Hearing/Vision screen Hearing Screening - Comments:: Denies hearing difficulties   Vision Screening - Comments:: Wears eyeglasses for reading - referral to [facility]   Goals Addressed               This Visit's Progress     Patient Stated (pt-stated)        Patient stated she plans to continue being active       Depression Screen     10/02/2023   12:46 PM 08/30/2022    9:02 AM 08/29/2022    8:55 AM 09/30/2021    3:34 PM 09/30/2021    3:32 PM 12/06/2020   11:35 AM 12/04/2019    2:52 PM  PHQ 2/9 Scores  PHQ - 2 Score 0 0 0 0 0 0 0  PHQ- 9 Score 0     0     Fall Risk     10/02/2023   12:44 PM 09/21/2022   11:24 AM 08/30/2022    9:02 AM 08/29/2022    8:53 AM 09/30/2021    3:34 PM   Fall Risk   Falls in the past year? 0 0 0 0 0  Number falls in past yr: 0 0 0 0 0  Injury with Fall? 0 0 0 0 0  Risk for fall due to : No Fall Risks No Fall Risks No Fall Risks No Fall Risks   Follow up Falls evaluation completed;Falls prevention discussed Falls evaluation completed Falls evaluation completed Falls prevention discussed Falls evaluation completed    MEDICARE RISK AT HOME:  Medicare Risk at Home Any stairs in or around the home?: No If so, are there any without handrails?: No Home free of loose throw rugs in walkways, pet beds, electrical cords, etc?: Yes Adequate lighting in your home to reduce risk of falls?: Yes Life alert?: No Use of a cane, walker or w/c?: Yes (cane) Grab bars in the bathroom?: No Shower chair or bench in shower?: No Elevated toilet seat or a handicapped toilet?: No  TIMED UP AND GO:  Was the test performed?  No  Cognitive Function: 6CIT completed        10/02/2023   12:49 PM 08/29/2022    8:56 AM  6CIT Screen  What Year? 0 points 0 points  What month? 0 points 0 points  What time? 0 points 0 points  Count back from 20 0 points 0 points  Months in reverse 0 points 0 points  Repeat phrase 0 points 0 points  Total Score 0 points 0 points    Immunizations Immunization History  Administered Date(s) Administered   PFIZER(Purple Top)SARS-COV-2 Vaccination 06/07/2019, 07/02/2019, 01/30/2020, 08/02/2020    Screening Tests Health Maintenance  Topic Date Due   Hepatitis C Screening  Never done   DTaP/Tdap/Td (1 - Tdap) Never done   Pneumonia Vaccine 40+ Years old (1 of 2 - PCV) Never done   Lung  Cancer Screening  09/09/2015   Colonoscopy  10/16/2018   COVID-19 Vaccine (5 - 2024-25 season) 12/31/2022   Medicare Annual Wellness (AWV)  08/29/2023   DEXA SCAN  Completed   HPV VACCINES  Aged Out   Meningococcal B Vaccine  Aged Out   INFLUENZA VACCINE  Discontinued   Zoster Vaccines- Shingrix  Discontinued    Health  Maintenance  Health Maintenance Due  Topic Date Due   Hepatitis C Screening  Never done   DTaP/Tdap/Td (1 - Tdap) Never done   Pneumonia Vaccine 55+ Years old (1 of 2 - PCV) Never done   Lung Cancer Screening  09/09/2015   Colonoscopy  10/16/2018   COVID-19 Vaccine (5 - 2024-25 season) 12/31/2022   Medicare Annual Wellness (AWV)  08/29/2023   Health Maintenance Items Addressed:  Referral sent to [facility] (smoker/hx smoking), Referral sent to Optometry/Ophthalmology, Labs Ordered: Hepatitis C Screening test ordered today  Vaccinations: declines all Influenza vaccine: recommend every Fall Pneumococcal vaccine: recommend once per lifetime Prevnar-20 Tdap vaccine: recommend every 10 years Shingles vaccine: recommend Shingrix which is 2 doses 2-6 months apart and over 90% effective     Covid-19: recommend 2 doses one month apart with a booster 6 months later   Additional Screening:  Vision Screening: Recommended annual ophthalmology exams for early detection of glaucoma and other disorders of the eye. Would you like a referral to an eye doctor? Yes  referral to Dr Stewart [facility]k [facility]  Dental Screening: Recommended annual dental exams for proper oral hygiene  Community Resource Referral / Chronic Care Management: CRR required this visit?  No   CCM required this visit?  No   Plan:    I have personally reviewed and noted the following in the patient's chart:   Medical and social history Use of alcohol, tobacco or illicit drugs  Current medications and supplements including opioid prescriptions. Patient is not currently taking opioid prescriptions. Functional ability and status Nutritional status Physical activity Advanced directives List of other physicians Hospitalizations, surgeries, and ER visits in previous 12 months Vitals Screenings to include cognitive, depression, and falls Referrals and appointments  In addition, I have reviewed and  discussed with patient certain preventive protocols, quality metrics, and best practice recommendations. A written personalized care plan for preventive services as well as general preventive health recommendations were provided to patient.   Patria Bookbinder, CMA   10/02/2023   After Visit Summary: (MyChart) Due to this being a telephonic visit, the after visit summary with patients personalized plan was offered to patient via MyChart   Notes: Nothing significant to report at this time.  Medical screening examination/treatment/procedure(s) were performed by non-physician practitioner and as supervising physician I was immediately available for consultation/collaboration.  I agree with above. Adelaide Holy, MD

## 2023-10-05 ENCOUNTER — Other Ambulatory Visit: Payer: Self-pay | Admitting: Internal Medicine

## 2023-10-24 ENCOUNTER — Encounter: Admitting: Internal Medicine

## 2024-02-06 ENCOUNTER — Telehealth: Payer: Self-pay | Admitting: Internal Medicine

## 2024-02-06 NOTE — Telephone Encounter (Unsigned)
 Copied from CRM [id number]. Topic: Clinical - Medication Refill >> Feb 06, 2024  4:13 PM Alfonso HERO wrote: Medication: atenolol -chlorthalidone  (TENORETIC ) 50-25 MG tablet    Has the patient contacted their pharmacy? Yes (Agent: If no, request that the patient contact the pharmacy for the refill. If patient does not wish to contact the pharmacy document the reason why and proceed with request.) (Agent: If yes, when and what did the pharmacy advise?)  This is the patient's preferred pharmacy:  [id number] [place], [place] - [address] AT [facility] [facility] [address] [place] [address] Phone: [phone] Fax: [phone]  Is this the correct pharmacy for this prescription? Yes If no, delete pharmacy and type the correct one.   Has the prescription been filled recently? Yes  Is the patient out of the medication? Yes  Has the patient been seen for an appointment in the last year OR does the patient have an upcoming appointment? Yes  Can we respond through MyChart? Yes  Agent: Please be advised that Rx refills may take up to 3 business days. We ask that you follow-up with your pharmacy.

## 2024-02-07 ENCOUNTER — Other Ambulatory Visit: Payer: Self-pay | Admitting: Internal Medicine

## 2024-03-05 ENCOUNTER — Other Ambulatory Visit: Payer: Self-pay | Admitting: Internal Medicine

## 2024-04-12 ENCOUNTER — Other Ambulatory Visit: Payer: Self-pay | Admitting: Internal Medicine

## 2024-05-08 ENCOUNTER — Other Ambulatory Visit: Payer: Self-pay | Admitting: Family

## 2024-05-14 ENCOUNTER — Other Ambulatory Visit: Payer: Self-pay | Admitting: Family

## 2024-05-16 ENCOUNTER — Telehealth: Payer: Self-pay

## 2024-05-16 NOTE — Telephone Encounter (Signed)
 Copied from CRM [id number]. Topic: Clinical - Medication Refill >> May 16, 2024  4:51 PM Viola F wrote: Medication: atenolol -chlorthalidone  (TENORETIC ) 50-25 MG tablet [[id number]]  Has the patient contacted their pharmacy? Yes (Agent: If no, request that the patient contact the pharmacy for the refill. If patient does not wish to contact the pharmacy document the reason why and proceed with request.) (Agent: If yes, when and what did the pharmacy advise?)  This is the patient's preferred pharmacy:  [id number] [place], [place] - [address] [facility] [address] [place] [address] Phone: [phone] Fax: [phone]  Is this the correct pharmacy for this prescription? Yes If no, delete pharmacy and type the correct one.   Has the prescription been filled recently? Yes  Is the patient out of the medication? Yes  Has the patient been seen for an appointment in the last year OR does the patient have an upcoming appointment? Yes  Can we respond through MyChart? Yes  Agent: Please be advised that Rx refills may take up to 3 business days. We ask that you follow-up with your pharmacy.

## 2024-05-19 ENCOUNTER — Ambulatory Visit: Admitting: Family Medicine

## 2024-05-19 NOTE — Telephone Encounter (Signed)
 Pt has not been seen by provider since 08/30/2022... Pt is needing to see PCP for any further medication refills

## 2024-10-03 ENCOUNTER — Ambulatory Visit
